# Patient Record
Sex: Male | Born: 1950 | Race: Black or African American | Hispanic: No | Marital: Married | Smoking: Never smoker
Health system: Southern US, Community
[De-identification: ages and names within clinical notes are randomized; demographics above are authoritative.]

## PROBLEM LIST (undated history)

## (undated) DIAGNOSIS — R972 Elevated prostate specific antigen [PSA]: Secondary | ICD-10-CM

## (undated) DIAGNOSIS — E785 Hyperlipidemia, unspecified: Secondary | ICD-10-CM

## (undated) DIAGNOSIS — T7840XA Allergy, unspecified, initial encounter: Secondary | ICD-10-CM

## (undated) DIAGNOSIS — M1990 Unspecified osteoarthritis, unspecified site: Secondary | ICD-10-CM

## (undated) DIAGNOSIS — N200 Calculus of kidney: Secondary | ICD-10-CM

## (undated) DIAGNOSIS — J45909 Unspecified asthma, uncomplicated: Secondary | ICD-10-CM

## (undated) DIAGNOSIS — Z87442 Personal history of urinary calculi: Secondary | ICD-10-CM

## (undated) HISTORY — DX: Calculus of kidney: N20.0

## (undated) HISTORY — DX: Hyperlipidemia, unspecified: E78.5

## (undated) HISTORY — DX: Unspecified asthma, uncomplicated: J45.909

## (undated) HISTORY — DX: Allergy, unspecified, initial encounter: T78.40XA

## (undated) HISTORY — DX: Elevated prostate specific antigen (PSA): R97.20

---

## 1992-09-20 HISTORY — PX: HERNIA REPAIR: SHX51

## 1999-09-21 HISTORY — PX: EXTRACORPOREAL SHOCK WAVE LITHOTRIPSY: SHX1557

## 2011-11-12 ENCOUNTER — Ambulatory Visit: Payer: Self-pay | Admitting: Gastroenterology

## 2012-02-24 DIAGNOSIS — S61211A Laceration without foreign body of left index finger without damage to nail, initial encounter: Secondary | ICD-10-CM | POA: Insufficient documentation

## 2013-10-24 DIAGNOSIS — H18603 Keratoconus, unspecified, bilateral: Secondary | ICD-10-CM | POA: Insufficient documentation

## 2014-03-14 DIAGNOSIS — R972 Elevated prostate specific antigen [PSA]: Secondary | ICD-10-CM | POA: Insufficient documentation

## 2015-03-31 ENCOUNTER — Ambulatory Visit: Payer: Self-pay | Admitting: Urology

## 2015-12-09 DIAGNOSIS — E7800 Pure hypercholesterolemia, unspecified: Secondary | ICD-10-CM | POA: Insufficient documentation

## 2015-12-09 DIAGNOSIS — L74510 Primary focal hyperhidrosis, axilla: Secondary | ICD-10-CM | POA: Insufficient documentation

## 2016-12-29 DIAGNOSIS — Z131 Encounter for screening for diabetes mellitus: Secondary | ICD-10-CM | POA: Insufficient documentation

## 2017-01-17 DIAGNOSIS — R7303 Prediabetes: Secondary | ICD-10-CM | POA: Insufficient documentation

## 2017-02-01 NOTE — Progress Notes (Signed)
02/03/2017 4:22 PM   Shane Abbott 1951/08/03 [id number]  Referring provider: Glendon Axe, MD [facility] [place], [place] [address]  Chief Complaint  Patient presents with  . New Patient (Initial Visit)    elevated psa referred by Dr. Glendon Axe    HPI: Patient is a 66 year old African American male who is referred by Dr. Glendon Axe for elevated PSA.  Patient's PSA was found to be 4.29 on 12/29/2016 during a routine physical exam. His previous PSAs have been 3.14 in June 2015 and 2.97 in March 2017.  BPH WITH LUTS His IPSS score today is 1, which is mild lower urinary tract symptomatology.   He is mostly satisfied with his quality life due to his urinary symptoms.  His major complaint today is frequency.   He has had these symptoms for several years.  He denies any dysuria, hematuria or suprapubic pain.   He also denies any recent fevers, chills, nausea or vomiting.  He does not have a family history of PCa.      IPSS    Row Name 02/03/17 1500         International Prostate Symptom Score   How often have you had the sensation of not emptying your bladder? Not at All     How often have you had to urinate less than every two hours? Less than 1 in 5 times     How often have you found you stopped and started again several times when you urinated? Not at All     How often have you found it difficult to postpone urination? Not at All     How often have you had a weak urinary stream? Not at All     How often have you had to strain to start urination? Not at All     How many times did you typically get up at night to urinate? None     Total IPSS Score 1       Quality of Life due to urinary symptoms   If you were to spend the rest of your life with your urinary condition just the way it is now how would you feel about that? Mostly Satisfied        Score:  1-7 Mild 8-19 Moderate 20-35 Severe   Erectile dysfunction His SHIM score  is 24, which is no ED.   His libido is preserved.   His risk factors for ED are age, BPH and HTN.   He denies any painful erections or curvatures with his erections.   He is still having/no longer having spontaneous erections.        SHIM    Row Name 02/03/17 1546         SHIM: Over the last 6 months:   How do you rate your confidence that you could get and keep an erection? High     When you had erections with sexual stimulation, how often were your erections hard enough for penetration (entering your partner)? Almost Always or Always     During sexual intercourse, how often were you able to maintain your erection after you had penetrated (entered) your partner? Almost Always or Always     During sexual intercourse, how difficult was it to maintain your erection to completion of intercourse? Not Difficult     When you attempted sexual intercourse, how often was it satisfactory for you? Almost Always or Always  SHIM Total Score   SHIM 24        Score: 1-7 Severe ED 8-11 Moderate ED 12-16 Mild-Moderate ED 17-21 Mild ED 22-25 No ED       PMH: No past medical history on file.  Surgical History: Past Surgical History:  Procedure Laterality Date  . EXTRACORPOREAL SHOCK WAVE LITHOTRIPSY    . HERNIA REPAIR  1998    Home Medications:  Allergies as of 02/03/2017   No Known Allergies     Medication List       Accurate as of 02/03/17  4:22 PM. Always use your most recent med list.          azithromycin 250 MG tablet Commonly known as:  ZITHROMAX Take 2 tablets (500mg ) by mouth on Day 1. Take 1 tablet (250mg ) by mouth on Days 2-5.   MULTI-VITAMINS Tabs Take by mouth.   sodium chloride 0.9 % nebulizer solution 5 ML ampule to be used off label for Scleral Contact lens insertion and rinsing.   Use as directed       Allergies: No Known Allergies  Family History: Family History  Problem Relation Age of Onset  . Prostate cancer Neg Hx   . Kidney cancer Neg  Hx   . Bladder Cancer Neg Hx   . Kidney disease Neg Hx     Social History:  reports that he has never smoked. He has never used smokeless tobacco. He reports that he drinks alcohol. He reports that he does not use drugs.  ROS: UROLOGY Frequent Urination?: No Hard to postpone urination?: No Burning/pain with urination?: No Get up at night to urinate?: No Leakage of urine?: No Urine stream starts and stops?: No Trouble starting stream?: No Do you have to strain to urinate?: No Blood in urine?: No Urinary tract infection?: No Sexually transmitted disease?: No Injury to kidneys or bladder?: No Painful intercourse?: No Weak stream?: No Erection problems?: No Penile pain?: No  Gastrointestinal Nausea?: No Vomiting?: No Indigestion/heartburn?: No Diarrhea?: No Constipation?: No  Constitutional Fever: No Night sweats?: No Weight loss?: No Fatigue?: No  Skin Skin rash/lesions?: No Itching?: No  Eyes Blurred vision?: No Double vision?: No  Ears/Nose/Throat Sore throat?: No Sinus problems?: No  Hematologic/Lymphatic Swollen glands?: No Easy bruising?: No  Cardiovascular Leg swelling?: No Chest pain?: No  Respiratory Cough?: No Shortness of breath?: No  Endocrine Excessive thirst?: No  Musculoskeletal Back pain?: No Joint pain?: No  Neurological Headaches?: No Dizziness?: No  Psychologic Depression?: No Anxiety?: No  Physical Exam: BP (!) 150/81   Pulse 82   Ht 6' (1.829 m)   Wt 186 lb (84.4 kg)   BMI 25.23 kg/m   Constitutional: Well nourished. Alert and oriented, No acute distress. HEENT: [place] AT, moist mucus membranes. Trachea midline, no masses. Cardiovascular: No clubbing, cyanosis, or edema. Respiratory: Normal respiratory effort, no increased work of breathing. GI: Abdomen is soft, non tender, non distended, no abdominal masses. Liver and spleen not palpable.  No hernias appreciated.  Stool sample for occult testing is not indicated.    GU: No CVA tenderness.  No bladder fullness or masses.  Patient with circumcised phallus.  Urethral meatus is patent.  No penile discharge. No penile lesions or rashes. Scrotum without lesions, cysts, rashes and/or edema.  Testicles are located scrotally bilaterally. No masses are appreciated in the testicles. Left and right epididymis are normal.  Small left hydrocele.   Rectal: Patient with  normal sphincter tone. Anus and perineum without scarring  or rashes. No rectal masses are appreciated. Prostate is approximately 60 + grams, no nodules are appreciated. Seminal vesicles could not be palpated due to prostate size.   Skin: No rashes, bruises or suspicious lesions. Lymph: No cervical or inguinal adenopathy. Neurologic: Grossly intact, no focal deficits, moving all 4 extremities. Psychiatric: Normal mood and affect.  Laboratory Data:  Assessment & Plan:    1. Elevated PSA  - I discussed with the patient that PSA is an acronym for  prostate specific antigen,  which is a protein made by the prostate gland and can be detected in the blood stream. I explained to the patient situations that would increase the PSA, such as: a man's age,  BPH, infection, recent intercourse/ejaculation, prostate infarction, recent urethroscopic manipulation (Foley placement/cystoscopy) and prostate cancer.    - We discussed that indications for prostate biopsy are defined by age and race specific PSA cutoffs as well as a PSA velocity of 0.75/year.  -  At this time, I have advised the patient that we will repeat the PSA to rule out lab error.  If that should return elevated, I will add a free and total PSA  2. BPH with LUTS  - IPSS score is 1/2  - Continue conservative management, avoiding bladder irritants and timed voiding's  - most bothersome symptoms is/are frequency  - RTC pending PSA  3. Erectile dysfunction  - SHIM score is 24  - RTC in 12 months for repeat SHIM score and exam   Return for pending PSA  results.  These notes generated with voice recognition software. I apologize for typographical errors.  Zara Council, [facility] [address] [place], [place] [address] [phone]

## 2017-02-03 ENCOUNTER — Encounter: Payer: Self-pay | Admitting: Urology

## 2017-02-03 ENCOUNTER — Ambulatory Visit (INDEPENDENT_AMBULATORY_CARE_PROVIDER_SITE_OTHER): Payer: BLUE CROSS/BLUE SHIELD | Admitting: Urology

## 2017-02-03 VITALS — BP 150/81 | HR 82 | Ht 72.0 in | Wt 186.0 lb

## 2017-02-03 DIAGNOSIS — N138 Other obstructive and reflux uropathy: Secondary | ICD-10-CM | POA: Diagnosis not present

## 2017-02-03 DIAGNOSIS — R972 Elevated prostate specific antigen [PSA]: Secondary | ICD-10-CM

## 2017-02-03 DIAGNOSIS — N401 Enlarged prostate with lower urinary tract symptoms: Secondary | ICD-10-CM | POA: Diagnosis not present

## 2017-02-04 ENCOUNTER — Telehealth: Payer: Self-pay

## 2017-02-04 LAB — PSA: PROSTATE SPECIFIC AG, SERUM: 4.6 ng/mL — AB (ref 0.0–4.0)

## 2017-02-04 NOTE — Telephone Encounter (Signed)
Labs added. Pt made aware of elevated PSA and additional test being ran.

## 2017-02-04 NOTE — Telephone Encounter (Signed)
-----   Message from Nori Riis, PA-C sent at 02/04/2017  6:16 AM EDT ----- Would you please add a free and total PSA to his blood work?  Also, please tell the patient that his PSA is still a little high at 4.6.

## 2017-02-09 LAB — PSA, TOTAL AND FREE
PSA FREE: 0.68 ng/mL
PSA, Free Pct: 14.8 %
Prostate Specific Ag, Serum: 4.6 ng/mL — ABNORMAL HIGH (ref 0.0–4.0)

## 2017-02-09 LAB — SPECIMEN STATUS REPORT

## 2017-02-15 ENCOUNTER — Telehealth: Payer: Self-pay

## 2017-02-15 DIAGNOSIS — R972 Elevated prostate specific antigen [PSA]: Secondary | ICD-10-CM

## 2017-02-15 NOTE — Telephone Encounter (Signed)
-----   Message from Nori Riis, PA-C sent at 02/14/2017  2:51 PM EDT ----- Please let the patient's known that the additional blood work noted a 24% probability of having prostate cancer at this time.  I would suggest that we repeat the PSA in 3 months to ensure stability.

## 2017-02-15 NOTE — Telephone Encounter (Signed)
Spoke with pt in reference to PSA results. Made pt aware will need to have labs again in 65mo. Lab appt made and orders placed. Pt voiced understanding.

## 2017-05-18 ENCOUNTER — Other Ambulatory Visit: Payer: BLUE CROSS/BLUE SHIELD

## 2017-05-18 DIAGNOSIS — R972 Elevated prostate specific antigen [PSA]: Secondary | ICD-10-CM

## 2017-05-19 LAB — PSA: Prostate Specific Ag, Serum: 3.3 ng/mL (ref 0.0–4.0)

## 2017-05-24 ENCOUNTER — Telehealth: Payer: Self-pay

## 2017-05-24 DIAGNOSIS — R972 Elevated prostate specific antigen [PSA]: Secondary | ICD-10-CM

## 2017-05-24 NOTE — Telephone Encounter (Signed)
-----   Message from Nori Riis, PA-C sent at 05/19/2017  8:29 AM EDT ----- Please let Mr Cryder know that his PSA has lowered to 3.3.  I would like to see him in 3 months.  He will need a PSA drawn prior to his appointment.

## 2017-05-24 NOTE — Telephone Encounter (Signed)
Spoke with pt in reference to PSA results. Made aware will need to be seen in 41mo. Pt voiced understanding. OV and lab appts made. Orders placed.

## 2017-08-18 ENCOUNTER — Other Ambulatory Visit: Payer: BLUE CROSS/BLUE SHIELD

## 2017-08-18 DIAGNOSIS — R972 Elevated prostate specific antigen [PSA]: Secondary | ICD-10-CM

## 2017-08-19 LAB — PSA: Prostate Specific Ag, Serum: 3.8 ng/mL (ref 0.0–4.0)

## 2017-08-22 NOTE — Progress Notes (Signed)
08/23/2017 9:02 AM   Shane Abbott [birth year] [id number]  Referring provider: Glendon Axe, MD [facility] [place], [place] [address]  Chief Complaint  Patient presents with  . Elevated PSA    HPI: Patient is a 66 year old African American male with a history of elevated PSA, BPH with LU TS and ED who presents today for a 3 months follow up.    History of elevated PSA 3.14 in 02/2014 2.97 in 11/2015 4.29 in 12/2016 4.6 in 01/2017 free PSA 0.68 3.3 in 04/2017 3.8 in 07/2017  BPH WITH LUTS His IPSS score today is 7, which is mild lower urinary tract symptomatology.   He is pleased with his quality life due to his urinary symptoms.  His previous I PSS score was 1/2.  His major complaint(s) today is/are nocturia on occasion.  He has had these symptoms for several months.  He denies any dysuria, hematuria or suprapubic pain.   He also denies any recent fevers, chills, nausea or vomiting.  He does not have a family history of PCa.  IPSS    Row Name 08/23/17 0800         International Prostate Symptom Score   How often have you had the sensation of not emptying your bladder?  Less than half the time     How often have you had to urinate less than every two hours?  Less than 1 in 5 times     How often have you found you stopped and started again several times when you urinated?  Less than 1 in 5 times     How often have you found it difficult to postpone urination?  Not at All     How often have you had a weak urinary stream?  Less than 1 in 5 times     How often have you had to strain to start urination?  Less than 1 in 5 times     How many times did you typically get up at night to urinate?  1 Time     Total IPSS Score  7       Quality of Life due to urinary symptoms   If you were to spend the rest of your life with your urinary condition just the way it is now how would you feel about that?  Pleased        Score:  1-7 Mild 8-19  Moderate 20-35 Severe   Erectile dysfunction His SHIM score is 21, which is mild ED.   His libido is preserved.   His previous SHIM score was 24.  His risk factors for ED are age, BPH and HTN.   He denies any painful erections or curvatures with his erections.   He is still having spontaneous erections.  [place] Name 08/23/17 0848         SHIM: Over the last 6 months:   How do you rate your confidence that you could get and keep an erection?  High     When you had erections with sexual stimulation, how often were your erections hard enough for penetration (entering your partner)?  Almost Always or Always     During sexual intercourse, how often were you able to maintain your erection after you had penetrated (entered) your partner?  A Few Times (much less than half the time)     During sexual intercourse, how difficult was it to maintain your erection to completion  of intercourse?  Not Difficult     When you attempted sexual intercourse, how often was it satisfactory for you?  Almost Always or Always       SHIM Total Score   SHIM  21        Score: 1-7 Severe ED 8-11 Moderate ED 12-16 Mild-Moderate ED 17-21 Mild ED 22-25 No ED   PMH: Past Medical History:  Diagnosis Date  . Allergy    Seasonal  . Asthma    as a child  . Elevated PSA   . Hyperlipidemia   . Kidney stone     Surgical History: Past Surgical History:  Procedure Laterality Date  . EXTRACORPOREAL SHOCK WAVE LITHOTRIPSY  2001  . HERNIA REPAIR  1994   Right Inguinal- [facility] Medications:  Allergies as of 08/23/2017   No Known Allergies     Medication List        Accurate as of 08/23/17  9:02 AM. Always use your most recent med list.          MULTI-VITAMINS Tabs Take 1 tablet by mouth daily.   sodium chloride 0.9 % nebulizer solution 5 ML ampule to be used off label for Scleral Contact lens insertion and rinsing.   Use as directed       Allergies: No Known  Allergies  Family History: Family History  Problem Relation Age of Onset  . Healthy Father   . Heart disease Mother   . Healthy Sister   . Healthy Sister   . Heart attack Brother   . Prostate cancer Neg Hx   . Kidney cancer Neg Hx   . Bladder Cancer Neg Hx   . Kidney disease Neg Hx     Social History:  reports that  has never smoked. he has never used smokeless tobacco. He reports that he drinks alcohol. He reports that he does not use drugs.  ROS: UROLOGY Frequent Urination?: No Hard to postpone urination?: No Burning/pain with urination?: No Get up at night to urinate?: Yes Leakage of urine?: No Urine stream starts and stops?: No Trouble starting stream?: No Do you have to strain to urinate?: No Blood in urine?: No Urinary tract infection?: No Sexually transmitted disease?: No Injury to kidneys or bladder?: No Painful intercourse?: No Weak stream?: No Erection problems?: No Penile pain?: No  Gastrointestinal Nausea?: No Vomiting?: No Indigestion/heartburn?: No Diarrhea?: No Constipation?: No  Constitutional Fever: No Night sweats?: No Weight loss?: No Fatigue?: No  Skin Skin rash/lesions?: No Itching?: No  Eyes Blurred vision?: No Double vision?: No  Ears/Nose/Throat Sore throat?: No Sinus problems?: No  Hematologic/Lymphatic Swollen glands?: No Easy bruising?: No  Cardiovascular Leg swelling?: No Chest pain?: No  Respiratory Cough?: No Shortness of breath?: No  Endocrine Excessive thirst?: No  Musculoskeletal Back pain?: No Joint pain?: No  Neurological Headaches?: No Dizziness?: No  Psychologic Depression?: No Anxiety?: No  Physical Exam: BP 130/83   Pulse 81   Ht 6' (1.829 m)   Wt 177 lb 9.6 oz (80.6 kg)   BMI 24.09 kg/m   Constitutional: Well nourished. Alert and oriented, No acute distress. HEENT: [place] AT, moist mucus membranes. Trachea midline, no masses. Cardiovascular: No clubbing, cyanosis, or  edema. Respiratory: Normal respiratory effort, no increased work of breathing. GI: Abdomen is soft, non tender, non distended, no abdominal masses. Liver and spleen not palpable.  No hernias appreciated.  Stool sample for occult testing is not indicated.   GU: No  CVA tenderness.  No bladder fullness or masses.  Patient with circumcised phallus.  Urethral meatus is patent.  No penile discharge. No penile lesions or rashes. Scrotum without lesions, cysts, rashes and/or edema.  Testicles are located scrotally bilaterally. No masses are appreciated in the testicles. Left and right epididymis are normal.  Small left hydrocele.   Rectal: Patient with  normal sphincter tone. Anus and perineum without scarring or rashes. No rectal masses are appreciated. Prostate is approximately 60 + grams, no nodules are appreciated. Seminal vesicles could not be palpated due to prostate size.   Skin: No rashes, bruises or suspicious lesions. Lymph: No cervical or inguinal adenopathy. Neurologic: Grossly intact, no focal deficits, moving all 4 extremities. Psychiatric: Normal mood and affect.  Laboratory Data:  Assessment & Plan:    1. Rising PSA  - current PSA is 3.8 - trending up  - discussed with the patient that PSA is an acronym for  prostate specific antigen,  which is a protein made by the prostate gland and can be detected in the blood stream. I explained to the patient situations that would increase the PSA, such as: a man's age,  BPH, infection, recent intercourse/ejaculation, prostate infarction, recent urethroscopic manipulation (Foley placement/cystoscopy) and prostate cancer.   -We discussed that indications for prostate biopsy are defined by age and race specific PSA cutoffs as well as a PSA velocity of 0.75/year.  - reviewed the implications of a rising PSA and the uncertainty surrounding it. In general, a man's PSA increases with age and is produced by both normal and cancerous prostate tissue.  Differential for a rise in PSA is BPH, prostate cancer, infection, recent intercourse/ejaculation, prostate infarction, recent urethroscopic manipulation (foley placement/cystoscopy) and prostatitis. Management of a rising PSA can include observation or prostate biopsy and we discussed this in detail.             - discussed repeating the PSA in 3 months or obtaining a 4K score at this time - I felt a prostate biopsy is not indicated at this time  - offered the 4K score test to the patient - explained to the patient that the 4K score test result is the individual patient's risk for aggressive prostate cancer of Gleason's score 7 and higher if a prostate biopsy were to be performed. The 4K score is calculated from blood test results of four kallikrein proteins: Total PSA, Free PSA, Intact PSA and human kallikrein-related peptidase 2 (hK2), combined with patient age, DRE result (if reported), and history of no prostate biopsy or prior negative prostate biopsy.    - patient would like to repeat the PSA in three months  2. BPH with LUTS  - IPSS score is 7/1  - Continue conservative management, avoiding bladder irritants and timed voiding's  - most bothersome symptoms is/are nocturia on occasion  - RTC in 6 months for IPSS, PSA, and exam   3. Erectile dysfunction  - SHIM score is 21, it is slightly worsening  - I explained to the patient that in order to achieve an erection it takes good functioning of the nervous system (parasympathetic and rs, sympathetic, sensory and motor), good blood flow into the erectile tissue of the penis and a desire to have sex  - I explained that conditions like diabetes, hypertension, coronary artery disease, peripheral vascular disease, smoking, alcohol consumption, age, sleep apnea and BPH can diminish the ability to have an erection  - I explained the ED may be a risk marker for  underlying CVD and he should follow up with PCP for further studies   - A recent study  published in Sex Med 2018 Apr 13 revealed moderate to vigorous aerobic exercise for 40 minutes 4 times per week can decrease erectile problems caused by physical inactivity, obesity, hypertension, metabolic syndrome and/or cardiovascular diseases  - RTC in 6 months for exam and SHIM  Return in about 3 months (around 11/21/2017) for PSA only.  These notes generated with voice recognition software. I apologize for typographical errors.  Zara Council, [facility] [address], [place], [place] [address] [phone]

## 2017-08-23 ENCOUNTER — Ambulatory Visit: Payer: BLUE CROSS/BLUE SHIELD | Admitting: Urology

## 2017-08-23 ENCOUNTER — Encounter: Payer: Self-pay | Admitting: Urology

## 2017-08-23 VITALS — BP 130/83 | HR 81 | Ht 72.0 in | Wt 177.6 lb

## 2017-08-23 DIAGNOSIS — N529 Male erectile dysfunction, unspecified: Secondary | ICD-10-CM | POA: Diagnosis not present

## 2017-08-23 DIAGNOSIS — N138 Other obstructive and reflux uropathy: Secondary | ICD-10-CM | POA: Diagnosis not present

## 2017-08-23 DIAGNOSIS — R972 Elevated prostate specific antigen [PSA]: Secondary | ICD-10-CM | POA: Insufficient documentation

## 2017-08-23 DIAGNOSIS — E785 Hyperlipidemia, unspecified: Secondary | ICD-10-CM | POA: Insufficient documentation

## 2017-08-23 DIAGNOSIS — N401 Enlarged prostate with lower urinary tract symptoms: Secondary | ICD-10-CM | POA: Diagnosis not present

## 2017-11-23 ENCOUNTER — Other Ambulatory Visit: Payer: BLUE CROSS/BLUE SHIELD

## 2017-11-23 ENCOUNTER — Other Ambulatory Visit: Payer: Self-pay

## 2017-11-23 DIAGNOSIS — R972 Elevated prostate specific antigen [PSA]: Secondary | ICD-10-CM

## 2017-11-24 LAB — PSA: Prostate Specific Ag, Serum: 3.4 ng/mL (ref 0.0–4.0)

## 2017-11-25 ENCOUNTER — Telehealth: Payer: Self-pay

## 2017-11-25 DIAGNOSIS — R972 Elevated prostate specific antigen [PSA]: Secondary | ICD-10-CM

## 2017-11-25 NOTE — Telephone Encounter (Signed)
-----   Message from Nori Riis, PA-C sent at 11/24/2017  7:44 AM EST ----- Please let Mr. Quant know that his PSA has come down to 3.4.  I would like to see him in 3 months for a repeated PSA and office visit.

## 2017-11-25 NOTE — Telephone Encounter (Signed)
Letter sent. Appts made and orders placed.

## 2018-02-21 ENCOUNTER — Other Ambulatory Visit: Payer: BLUE CROSS/BLUE SHIELD

## 2018-02-21 DIAGNOSIS — R972 Elevated prostate specific antigen [PSA]: Secondary | ICD-10-CM

## 2018-02-22 LAB — PSA: Prostate Specific Ag, Serum: 3.9 ng/mL (ref 0.0–4.0)

## 2018-02-23 ENCOUNTER — Ambulatory Visit: Payer: Self-pay | Admitting: Urology

## 2018-03-01 ENCOUNTER — Ambulatory Visit: Payer: Self-pay | Admitting: Urology

## 2018-03-18 NOTE — Progress Notes (Deleted)
03/20/2018 2:08 PM   Shane Abbott March 04, 1951 [id number]  Referring provider: Glendon Axe, MD [facility] [place], [place] [address]  No chief complaint on file.   HPI: Patient is a 67 year old African American male with a history of elevated PSA, BPH with LU TS and ED who presents today for a 3 months follow up.    History of elevated PSA 3.14 in 02/2014 2.97 in 11/2015 4.29 in 12/2016 4.6 in 01/2017 free PSA 0.68 3.3 in 04/2017 3.8 in 07/2017 3.4 in 11/2017 3.9 in 02/2018  BPH WITH LUTS His IPSS score today is ***, which is *** lower urinary tract symptomatology.   He is *** with his quality life due to his urinary symptoms.  His previous I PSS score was 7/1.  His major complaint(s) today is/are nocturia on occasion.  He has had these symptoms for several months.  He denies any dysuria, hematuria or suprapubic pain.   He also denies any recent fevers, chills, nausea or vomiting.  He does not have a family history of PCa.  ***    Score:  1-7 Mild 8-19 Moderate 20-35 Severe   Erectile dysfunction His SHIM score is ***, which is *** ED.   His libido is preserved.   His previous SHIM score was 21.  His risk factors for ED are age, BPH and HTN.   He denies any painful erections or curvatures with his erections.   He is still having spontaneous erections.    Score: 1-7 Severe ED 8-11 Moderate ED 12-16 Mild-Moderate ED 17-21 Mild ED 22-25 No ED   PMH: Past Medical History:  Diagnosis Date  . Allergy    Seasonal  . Asthma    as a child  . Elevated PSA   . Hyperlipidemia   . Kidney stone     Surgical History: Past Surgical History:  Procedure Laterality Date  . EXTRACORPOREAL SHOCK WAVE LITHOTRIPSY  2001  . HERNIA REPAIR  1994   Right Inguinal- [facility] Medications:  Allergies as of 03/20/2018   No Known Allergies     Medication List        Accurate as of 03/18/18  2:08 PM. Always use  your most recent med list.          MULTI-VITAMINS Tabs Take 1 tablet by mouth daily.   sodium chloride 0.9 % nebulizer solution 5 ML ampule to be used off label for Scleral Contact lens insertion and rinsing.   Use as directed       Allergies: No Known Allergies  Family History: Family History  Problem Relation Age of Onset  . Healthy Father   . Heart disease Mother   . Healthy Sister   . Healthy Sister   . Heart attack Brother   . Prostate cancer Neg Hx   . Kidney cancer Neg Hx   . Bladder Cancer Neg Hx   . Kidney disease Neg Hx     Social History:  reports that he has never smoked. He has never used smokeless tobacco. He reports that he drinks alcohol. He reports that he does not use drugs.  ROS:                                        Physical Exam: There were no vitals taken for this visit.  Constitutional: Well nourished.  Alert and oriented, No acute distress. HEENT: [place] AT, moist mucus membranes. Trachea midline, no masses. Cardiovascular: No clubbing, cyanosis, or edema. Respiratory: Normal respiratory effort, no increased work of breathing. GI: Abdomen is soft, non tender, non distended, no abdominal masses. Liver and spleen not palpable.  No hernias appreciated.  Stool sample for occult testing is not indicated.   GU: No CVA tenderness.  No bladder fullness or masses.  Patient with circumcised phallus.  Urethral meatus is patent.  No penile discharge. No penile lesions or rashes. Scrotum without lesions, cysts, rashes and/or edema.  Testicles are located scrotally bilaterally. No masses are appreciated in the testicles. Left and right epididymis are normal.  Small left hydrocele.   Rectal: Patient with  normal sphincter tone. Anus and perineum without scarring or rashes. No rectal masses are appreciated. Prostate is approximately 60 + grams, no nodules are appreciated. Seminal vesicles could not be palpated due to prostate size.   Skin: No  rashes, bruises or suspicious lesions. Lymph: No cervical or inguinal adenopathy. Neurologic: Grossly intact, no focal deficits, moving all 4 extremities. Psychiatric: Normal mood and affect. ***  Constitutional: Well nourished. Alert and oriented, No acute distress. HEENT: [place] AT, moist mucus membranes. Trachea midline, no masses. Cardiovascular: No clubbing, cyanosis, or edema. Respiratory: Normal respiratory effort, no increased work of breathing. GI: Abdomen is soft, non tender, non distended, no abdominal masses. Liver and spleen not palpable.  No hernias appreciated.  Stool sample for occult testing is not indicated.   GU: No CVA tenderness.  No bladder fullness or masses.  Patient with circumcised/uncircumcised phallus. ***Foreskin easily retracted***  Urethral meatus is patent.  No penile discharge. No penile lesions or rashes. Scrotum without lesions, cysts, rashes and/or edema.  Testicles are located scrotally bilaterally. No masses are appreciated in the testicles. Left and right epididymis are normal. Rectal: Patient with  normal sphincter tone. Anus and perineum without scarring or rashes. No rectal masses are appreciated. Prostate is approximately *** grams, *** nodules are appreciated. Seminal vesicles are normal. Skin: No rashes, bruises or suspicious lesions. Lymph: No cervical or inguinal adenopathy. Neurologic: Grossly intact, no focal deficits, moving all 4 extremities. Psychiatric: Normal mood and affect.   Laboratory Data:  Assessment & Plan:    1. Rising PSA velocity PSA is trending up with current PSA at 3.9 NCCN guidelines state that a PSA over 3 is concerning for prostate cancer I would recommend a prostate biopsy at this time rather than to continue to monitoring the PSA at closer intervals as this may result in a delay in prostate cancer diagnosis *** ***  2. BPH with LUTS IPSS score is 7/1 Continue conservative management, avoiding bladder irritants and  timed voiding's most bothersome symptoms is/are nocturia on occasion RTC in ***  3. Erectile dysfunction SHIM score is 21, it is slightly worsening RTC in 6 months for exam and SHIM  No follow-ups on file.  These notes generated with voice recognition software. I apologize for typographical errors.  Zara Council, [facility] [address], [place], [place] [address] [phone]

## 2018-03-20 ENCOUNTER — Ambulatory Visit: Payer: Self-pay | Admitting: Urology

## 2018-04-16 NOTE — Progress Notes (Addendum)
04/18/2018 7:34 AM   Shane Abbott 06/09/1951 [id number]  Referring provider: Glendon Axe, MD [facility] [place], [place] [address]  Chief Complaint  Patient presents with  . Elevated PSA    HPI: Patient is a 67 year old African American male with a history of elevated PSA, BPH with LU TS and ED who presents today for a 3 months follow up.    Since his last visit with [place], he has been diagnosed with a right ureteral stone.  He states about one week ago, he had the sudden onset of right flank pain.  6/10 pain.  He states it was colicky in nature.  Patient denies any gross hematuria, dysuria or suprapubic/flank pain.  Patient denies any fevers, chills, nausea or vomiting.  CT Renal stone study on 04/11/2018 noted  6 mm mid right ureteral stone with mild right hydroureteronephrosis and trace perinephric stranding. He was given Percocet and Flomax in the [facility] ER.  He did not fill the Flomax due to his fear of sexual side effects.   History of elevated PSA 3.14 in 02/2014 2.97 in 11/2015 4.29 in 12/2016 4.6 in 01/2017 free PSA 0.68 3.3 in 04/2017 3.8 in 07/2017 3.4 in 11/2017 3.9 in 02/2018  BPH WITH LUTS His IPSS score today is 2, which is mild lower urinary tract symptomatology.   He is pleased with his quality life due to his urinary symptoms.  His previous I PSS score was 7/1.  His major complaint(s) today is blood in the urine.  He denies any dysuria, hematuria or suprapubic pain.   He also denies any recent fevers, chills, nausea or vomiting.  He does not have a family history of PCa.    IPSS    Row Name 04/18/18 0900         International Prostate Symptom Score   How often have you had the sensation of not emptying your bladder?  Not at All     How often have you had to urinate less than every two hours?  Less than 1 in 5 times     How often have you found you stopped and started again several times when you urinated?  Not at All     How often have you found it difficult to postpone urination?  Not at All     How often have you had a weak urinary stream?  Not at All     How often have you had to strain to start urination?  Less than 1 in 5 times     Total IPSS Score  2       Quality of Life due to urinary symptoms   If you were to spend the rest of your life with your urinary condition just the way it is now how would you feel about that?  Pleased        Score:  1-7 Mild 8-19 Moderate 20-35 Severe   Erectile dysfunction His SHIM score is 24, which is no ED.   His libido is preserved.   His previous SHIM score was 21.  His risk factors for ED are age, BPH and HTN.   He denies any painful erections or curvatures with his erections.   He is still having spontaneous erections.  SHIM    Row Name 04/18/18 0919         SHIM: Over the last 6 months:   How do you rate your confidence that you could get and  keep an erection?  High     When you had erections with sexual stimulation, how often were your erections hard enough for penetration (entering your partner)?  Almost Always or Always     During sexual intercourse, how often were you able to maintain your erection after you had penetrated (entered) your partner?  Almost Always or Always     During sexual intercourse, how difficult was it to maintain your erection to completion of intercourse?  Not Difficult     When you attempted sexual intercourse, how often was it satisfactory for you?  Almost Always or Always       SHIM Total Score   SHIM  24        Score: 1-7 Severe ED 8-11 Moderate ED 12-16 Mild-Moderate ED 17-21 Mild ED 22-25 No ED   PMH: Past Medical History:  Diagnosis Date  . Allergy    Seasonal  . Asthma    as a child  . Elevated PSA   . Hyperlipidemia   . Kidney stone     Surgical History: Past Surgical History:  Procedure Laterality Date  . EXTRACORPOREAL SHOCK WAVE LITHOTRIPSY  2001  . HERNIA REPAIR  1994   Right Inguinal-  [facility] Medications:  Allergies as of 04/18/2018   No Known Allergies     Medication List        Accurate as of 04/18/18 11:59 PM. Always use your most recent med list.          MULTI-VITAMINS Tabs Take 1 tablet by mouth daily.   oxyCODONE-acetaminophen 5-325 MG tablet Commonly known as:  PERCOCET/ROXICET Take 1 tablet by mouth every 8 (eight) hours as needed. for pain   sodium chloride 0.9 % nebulizer solution 5 ML ampule to be used off label for Scleral Contact lens insertion and rinsing.   Use as directed   tamsulosin 0.4 MG Caps capsule Commonly known as:  FLOMAX Take by mouth.       Allergies: No Known Allergies  Family History: Family History  Problem Relation Age of Onset  . Healthy Father   . Heart disease Mother   . Healthy Sister   . Healthy Sister   . Heart attack Brother   . Prostate cancer Neg Hx   . Kidney cancer Neg Hx   . Bladder Cancer Neg Hx   . Kidney disease Neg Hx     Social History:  reports that he has never smoked. He has never used smokeless tobacco. He reports that he drinks alcohol. He reports that he does not use drugs.  ROS: UROLOGY Frequent Urination?: No Hard to postpone urination?: No Burning/pain with urination?: No Get up at night to urinate?: No Leakage of urine?: No Urine stream starts and stops?: No Trouble starting stream?: No Do you have to strain to urinate?: No Blood in urine?: Yes Urinary tract infection?: No Sexually transmitted disease?: No Injury to kidneys or bladder?: No Painful intercourse?: No Weak stream?: No Erection problems?: No Penile pain?: No  Gastrointestinal Nausea?: No Vomiting?: No Indigestion/heartburn?: No Diarrhea?: No Constipation?: No  Constitutional Fever: No Night sweats?: No Weight loss?: Yes Fatigue?: No  Skin Skin rash/lesions?: No Itching?: No  Eyes Blurred vision?: No Double vision?: No  Ears/Nose/Throat Sore throat?: No Sinus  problems?: No  Hematologic/Lymphatic Swollen glands?: No Easy bruising?: No  Cardiovascular Leg swelling?: No Chest pain?: No  Respiratory Cough?: No Shortness of breath?: No  Endocrine Excessive thirst?: No  Musculoskeletal  Back pain?: No Joint pain?: No  Neurological Headaches?: No Dizziness?: No  Psychologic Depression?: No Anxiety?: No  Physical Exam: BP 111/70   Pulse 64   Ht 6' (1.829 m)   Wt 177 lb 8 oz (80.5 kg)   BMI 24.07 kg/m   Constitutional: Well nourished. Alert and oriented, No acute distress. HEENT: [place] AT, moist mucus membranes. Trachea midline, no masses. Cardiovascular: No clubbing, cyanosis, or edema. Respiratory: Normal respiratory effort, no increased work of breathing. GI: Abdomen is soft, non tender, non distended, no abdominal masses. Liver and spleen not palpable.  No hernias appreciated.  Stool sample for occult testing is not indicated.   GU: No CVA tenderness.  No bladder fullness or masses.  Patient with circumcised phallus.  Urethral meatus is patent.  No penile discharge. No penile lesions or rashes. Scrotum without lesions, cysts, rashes and/or edema.  Testicles are located scrotally bilaterally. No masses are appreciated in the testicles. Left and right epididymis are normal. Rectal: Patient with  normal sphincter tone. Anus and perineum without scarring or rashes. No rectal masses are appreciated. Prostate is approximately 60 + grams, no nodules are appreciated. Seminal vesicles are normal. Skin: No rashes, bruises or suspicious lesions. Lymph: No cervical or inguinal adenopathy. Neurologic: Grossly intact, no focal deficits, moving all 4 extremities. Psychiatric: Normal mood and affect.   Laboratory Data:  Pertinent Imaging Result Narrative  EXAM:CT Abdomen and CT Pelvis, Unenhanced, Stone Protocol   INDICATION:67 year old with flank pain. Right lower quadrant abdominal pain..  COMPARISON: None.  TECHNIQUE: Helical  axial images were obtained from the top of kidneys to the symphysis pubis with no oral or intravenous contrast. Dose reduction was obtained with Automatic Exposure Control (AEC). If AEC could not be utilized then dose was determined by manual adjustment of the mA and/or kV according to patient size.  FINDINGS:   Note: This study is specifically tailored for the evaluation of the kidneys and ureters with regards to stone disease.Identification of pathology of the solid organs and bowel is limited due to the absence of intravenous contrast.  Right Kidney and collecting system: There is a 6 mm stone in the mid right ureter (2:48 corresponding with 3:30) with mild right hydroureteronephrosis proximal to the stone and trace periureteral stranding. The distal half of the ureter is decompressed. No additional stones are present.  Left Kidney and collecting system: There is no renal calculus, hydronephrosis, or perinephric edema.There is no hydroureter or ureteral calculus.  Bladder: No abnormalities identified.  Pelvis: No free fluid in the pelvis. Prostatomegaly.  Abdomen:  Sensitivity for detection of visceral pathology is limited without IV contrast. Gallbladder is decompressed. Liver, pancreas and spleen are all normal. No adrenal mass. Moderate stool in the colon. No bowel obstruction. Mild fatty change in the left inguinal canal. No destructive or suspicious bony lesions.  The lung bases are clear.  IMPRESSION:  6 mm mid right ureteral stone with mild right hydroureteronephrosis and trace perinephric stranding.   MIPS 362: DICOM format image data available to non-affiliated external healthcare facilities or entities on a secure, media free, reciprocally searchable basis with patient authorization for at least a 12 month period after the study.    Electronically Signed JJ:KKXFGH Fish, MD, [facility] Electronically Signed on:04/11/2018 3:14 PM   Cannot  see the films.    Pertinent Imaging CLINICAL DATA:  History of right-sided kidney stone. Currently asymptomatic. Unsure if the patient has passed the most recent stone.  EXAM: ABDOMEN - 1 VIEW  COMPARISON:  None in PACs  FINDINGS: No abnormal calcifications are observed overlying either kidney. There is ill-defined calcification that may lie at the right UPJ or in the proximal right ureter. It lies between the right transverse processes of L3 and L4 and measures approximately 3 by 5 mm. More inferiorly no ureteral stones or bladder stones are observed.  The bowel gas pattern is unremarkable. The bony thorax exhibits no acute abnormality.  IMPRESSION: Probable 3 x 5 mm stone in the proximal right ureter.   Electronically Signed   By: David  Martinique M.D.   On: 04/18/2018 14:26  I have independently reviewed the films  Assessment & Plan:    1. Right ureteral stone KUB today demonstrates the persistence of the stone Encouraged him to fill the Flomax prescription Advised to contact our office or seek treatment in the ED if becomes febrile or pain/ vomiting are difficult control in order to arrange for emergent/urgent intervention  2. Rising PSA velocity PSA is trending up with current PSA at 3.9 NCCN guidelines state that a PSA over 3 is concerning for prostate cancer I would recommend a prostate biopsy at this time rather than to continue to monitoring the PSA at closer intervals as this may result in a delay in prostate cancer diagnosis - he is hesitant to have a biopsy at this time - will recheck a PSA today to see if it returns to baseline  3. BPH with LUTS IPSS score is 2/1, it is improving Continue conservative management, avoiding bladder irritants and timed voiding's most bothersome symptoms is/are nocturia on occasion RTC in pending PSA   4. Erectile dysfunction SHIM score is 24, it is improving  RTC in 6 months for exam and SHIM  Return for pending  PSA .  These notes generated with voice recognition software. I apologize for typographical errors.  Royden Purl  [facility] [address] [place], [place] [address] [phone]  Addendum: Patient contacted the office and he now wants to pursue right ESWL at this time.  He will be schedule right ESWL at this time.

## 2018-04-18 ENCOUNTER — Telehealth: Payer: Self-pay | Admitting: Family Medicine

## 2018-04-18 ENCOUNTER — Ambulatory Visit
Admission: RE | Admit: 2018-04-18 | Discharge: 2018-04-18 | Disposition: A | Discharge status: Home / Self Care | Payer: BLUE CROSS/BLUE SHIELD | Source: Ambulatory Visit | Attending: Urology | Admitting: Urology

## 2018-04-18 ENCOUNTER — Encounter: Payer: Self-pay | Admitting: Urology

## 2018-04-18 ENCOUNTER — Ambulatory Visit: Payer: BLUE CROSS/BLUE SHIELD | Admitting: Urology

## 2018-04-18 ENCOUNTER — Other Ambulatory Visit: Payer: Self-pay

## 2018-04-18 VITALS — BP 111/70 | HR 64 | Ht 72.0 in | Wt 177.5 lb

## 2018-04-18 DIAGNOSIS — N138 Other obstructive and reflux uropathy: Secondary | ICD-10-CM

## 2018-04-18 DIAGNOSIS — N201 Calculus of ureter: Secondary | ICD-10-CM | POA: Diagnosis not present

## 2018-04-18 DIAGNOSIS — R972 Elevated prostate specific antigen [PSA]: Secondary | ICD-10-CM | POA: Diagnosis not present

## 2018-04-18 DIAGNOSIS — R109 Unspecified abdominal pain: Secondary | ICD-10-CM | POA: Insufficient documentation

## 2018-04-18 DIAGNOSIS — N529 Male erectile dysfunction, unspecified: Secondary | ICD-10-CM

## 2018-04-18 DIAGNOSIS — N401 Enlarged prostate with lower urinary tract symptoms: Secondary | ICD-10-CM | POA: Diagnosis not present

## 2018-04-18 NOTE — Telephone Encounter (Signed)
-----   Message from Nori Riis, PA-C sent at 04/18/2018  2:46 PM EDT ----- Please let Mr. Kegley know that his stone is still there.  We should take another x-ray in 2 to 3 weeks.

## 2018-04-18 NOTE — Telephone Encounter (Signed)
Patient notified and will go for KUB in a couple weeks. KUB is ordered

## 2018-04-19 ENCOUNTER — Telehealth: Payer: Self-pay

## 2018-04-19 LAB — PSA: Prostate Specific Ag, Serum: 4 ng/mL (ref 0.0–4.0)

## 2018-04-19 NOTE — Telephone Encounter (Signed)
-----   Message from Nori Riis, PA-C sent at 04/19/2018  4:43 PM EDT ----- Please let Shane Abbott know that his PSA is still at 4.0.  I would like him to come in the next two weeks, so that we can get another x-ray and talk about his PSA.

## 2018-04-19 NOTE — Telephone Encounter (Signed)
Pt informed, he will call our office next week to schedule.

## 2018-04-21 ENCOUNTER — Telehealth: Payer: Self-pay | Admitting: Urology

## 2018-04-21 NOTE — Telephone Encounter (Signed)
Patient called and stated that he went to [facility] on 04-11-18 and had a CT scan and it showed a 6 mm stone and he wants to know if he can have to blasted? He said he was not in any pain but he can feel it moving and wants to do something about it.   Sharyn Lull

## 2018-04-21 NOTE — Telephone Encounter (Signed)
Yes.  Schedule him for ESWL.

## 2018-04-21 NOTE — Telephone Encounter (Signed)
That would be fine.  Please have [name] schedule him for right ESWL.

## 2018-04-21 NOTE — Telephone Encounter (Signed)
Patient will come to office 04/24/2018 to fill out ESWL paperwork & provide a urine sample for culture prior to procedure planned for 04/27/2018.

## 2018-04-21 NOTE — Telephone Encounter (Signed)
[name], See the message below from [facility], patient needs to be set up for ESWL and can you please call him back to let him know what we are doing?   Thanks,  Sharyn Lull

## 2018-04-24 ENCOUNTER — Other Ambulatory Visit: Payer: Self-pay | Admitting: Radiology

## 2018-04-24 ENCOUNTER — Other Ambulatory Visit: Payer: BLUE CROSS/BLUE SHIELD

## 2018-04-24 DIAGNOSIS — N201 Calculus of ureter: Secondary | ICD-10-CM

## 2018-04-24 LAB — URINALYSIS, COMPLETE
Bilirubin, UA: NEGATIVE
Glucose, UA: NEGATIVE
KETONES UA: NEGATIVE
NITRITE UA: NEGATIVE
Protein, UA: NEGATIVE
SPEC GRAV UA: 1.01 (ref 1.005–1.030)
Urobilinogen, Ur: 0.2 mg/dL (ref 0.2–1.0)
pH, UA: 6 (ref 5.0–7.5)

## 2018-04-24 LAB — MICROSCOPIC EXAMINATION

## 2018-04-25 ENCOUNTER — Telehealth: Payer: Self-pay | Admitting: Radiology

## 2018-04-25 NOTE — Telephone Encounter (Signed)
Contacted patient regarding [employer] has terminated on 04/20/2018 & to get new insurance information. Patient states he thought Medicare will not go into effect until his birthdate 05/01/2016. Patient is going to confirm that Medicare is active & notify the office. ESWL will be postponed 1 week to give patient time to confirm. Questions answered. Patient voices understanding.

## 2018-04-26 LAB — CULTURE, URINE COMPREHENSIVE

## 2018-04-27 ENCOUNTER — Ambulatory Visit: Admission: RE | Admit: 2018-04-27 | Payer: BLUE CROSS/BLUE SHIELD | Source: Ambulatory Visit | Admitting: Urology

## 2018-04-27 ENCOUNTER — Encounter: Admission: RE | Payer: Self-pay | Source: Ambulatory Visit

## 2018-04-27 SURGERY — LITHOTRIPSY, ESWL
Anesthesia: Moderate Sedation | Laterality: Right

## 2018-05-03 ENCOUNTER — Other Ambulatory Visit: Payer: Self-pay | Admitting: Radiology

## 2018-05-03 DIAGNOSIS — N201 Calculus of ureter: Secondary | ICD-10-CM

## 2018-05-04 ENCOUNTER — Ambulatory Visit
Admission: RE | Admit: 2018-05-04 | Discharge: 2018-05-04 | Disposition: A | Discharge status: Home / Self Care | Payer: Medicare Other | Source: Ambulatory Visit | Attending: Urology | Admitting: Urology

## 2018-05-04 ENCOUNTER — Ambulatory Visit: Payer: Medicare Other

## 2018-05-04 ENCOUNTER — Encounter
Admission: RE | Disposition: A | Discharge status: Home / Self Care | Payer: Self-pay | Source: Ambulatory Visit | Attending: Urology

## 2018-05-04 DIAGNOSIS — N201 Calculus of ureter: Secondary | ICD-10-CM | POA: Diagnosis not present

## 2018-05-04 HISTORY — DX: Personal history of urinary calculi: Z87.442

## 2018-05-04 HISTORY — PX: EXTRACORPOREAL SHOCK WAVE LITHOTRIPSY: SHX1557

## 2018-05-04 SURGERY — LITHOTRIPSY, ESWL
Anesthesia: Moderate Sedation | Laterality: Right

## 2018-05-04 MED ORDER — ONDANSETRON HCL 4 MG/2ML IJ SOLN
INTRAMUSCULAR | Status: AC
  Administered 2018-05-04: 4 mg via INTRAVENOUS
  Filled 2018-05-04: qty 2

## 2018-05-04 MED ORDER — CIPROFLOXACIN HCL 500 MG PO TABS
ORAL_TABLET | ORAL | Status: AC
  Administered 2018-05-04: 500 mg via ORAL
  Filled 2018-05-04: qty 1

## 2018-05-04 MED ORDER — CIPROFLOXACIN HCL 500 MG PO TABS
500.0000 mg | ORAL_TABLET | ORAL | Status: AC
  Administered 2018-05-04: 500 mg via ORAL

## 2018-05-04 MED ORDER — DIPHENHYDRAMINE HCL 25 MG PO CAPS
ORAL_CAPSULE | ORAL | Status: AC
  Administered 2018-05-04: 25 mg via ORAL
  Filled 2018-05-04: qty 1

## 2018-05-04 MED ORDER — DIAZEPAM 5 MG PO TABS
10.0000 mg | ORAL_TABLET | ORAL | Status: AC
  Administered 2018-05-04: 10 mg via ORAL

## 2018-05-04 MED ORDER — DIPHENHYDRAMINE HCL 25 MG PO CAPS
25.0000 mg | ORAL_CAPSULE | ORAL | Status: AC
  Administered 2018-05-04: 25 mg via ORAL

## 2018-05-04 MED ORDER — DIAZEPAM 5 MG PO TABS
ORAL_TABLET | ORAL | Status: AC
  Administered 2018-05-04: 10 mg via ORAL
  Filled 2018-05-04: qty 2

## 2018-05-04 MED ORDER — ONDANSETRON HCL 4 MG/2ML IJ SOLN
4.0000 mg | Freq: Once | INTRAMUSCULAR | Status: AC | PRN
  Administered 2018-05-04: 4 mg via INTRAVENOUS

## 2018-05-04 MED ORDER — SODIUM CHLORIDE 0.9 % IV SOLN
INTRAVENOUS | Status: DC
  Administered 2018-05-04: 07:00:00 via INTRAVENOUS

## 2018-05-04 NOTE — Discharge Instructions (Addendum)
AMBULATORY SURGERY  °DISCHARGE INSTRUCTIONS ° ° °1) The drugs that you were given will stay in your system until tomorrow so for the next 24 hours you should not: ° °A) Drive an automobile °B) Make any legal decisions °C) Drink any alcoholic beverage ° ° °2) You may resume regular meals tomorrow.  Today it is better to start with liquids and gradually work up to solid foods. ° °You may eat anything you prefer, but it is better to start with liquids, then soup and crackers, and gradually work up to solid foods. ° ° °3) Please notify your doctor immediately if you have any unusual bleeding, trouble breathing, redness and pain at the surgery site, drainage, fever, or pain not relieved by medication. ° ° ° °4) Additional Instructions:   FOLLOW [facility] INSTRUCTION SHEET AS REVIEWED. ° ° ° ° ° ° ° °Please contact your physician with any problems or Same Day Surgery at [phone], Monday through Friday 6 am to 4 pm, or [place] [facility] [place] Main number at [phone]. °

## 2018-05-05 ENCOUNTER — Encounter: Payer: Self-pay | Admitting: Urology

## 2018-05-11 ENCOUNTER — Ambulatory Visit: Payer: BLUE CROSS/BLUE SHIELD | Admitting: Urology

## 2018-05-24 ENCOUNTER — Encounter: Payer: Self-pay | Admitting: Urology

## 2018-05-24 ENCOUNTER — Ambulatory Visit
Admission: RE | Admit: 2018-05-24 | Discharge: 2018-05-24 | Disposition: A | Discharge status: Home / Self Care | Payer: Medicare Other | Source: Ambulatory Visit | Attending: Urology | Admitting: Urology

## 2018-05-24 ENCOUNTER — Ambulatory Visit (INDEPENDENT_AMBULATORY_CARE_PROVIDER_SITE_OTHER): Payer: Medicare Other | Admitting: Urology

## 2018-05-24 VITALS — BP 128/87 | HR 64 | Ht 72.0 in | Wt 181.0 lb

## 2018-05-24 DIAGNOSIS — N132 Hydronephrosis with renal and ureteral calculous obstruction: Secondary | ICD-10-CM

## 2018-05-24 DIAGNOSIS — Z87442 Personal history of urinary calculi: Secondary | ICD-10-CM

## 2018-05-24 DIAGNOSIS — R3129 Other microscopic hematuria: Secondary | ICD-10-CM

## 2018-05-24 DIAGNOSIS — N201 Calculus of ureter: Secondary | ICD-10-CM

## 2018-05-24 NOTE — Progress Notes (Signed)
05/24/2018 2:45 PM   Shane Abbott October 31, 1950 [id number]  Referring provider: Glendon Axe, MD [place] [facility] [place], [place] [address]  No chief complaint on file.   HPI: Patient is a 67 year old African American male with a history of nephrolithiasis who is status post ESWL who presents today for follow-up  CT Renal stone study on 04/11/2018 noted 6 mm mid right ureteral stone with mild right hydroureteronephrosis and trace perinephric stranding. He was given Percocet and Flomax in the [facility] ER.    He underwent right ESWL on 05/04/2018.  His postprocedural course was uneventful and as expected.    KUB today (05/24/2018) noted no stones.    He brings in fragments for analysis.  He is on any further flank pain.  Patient denies any gross hematuria, dysuria or suprapubic/flank pain.  Patient denies any fevers, chills, nausea or vomiting.     PMH: Past Medical History:  Diagnosis Date  . Allergy    Seasonal  . Asthma    as a child  . Elevated PSA   . History of kidney stones   . Hyperlipidemia   . Kidney stone     Surgical History: Past Surgical History:  Procedure Laterality Date  . EXTRACORPOREAL SHOCK WAVE LITHOTRIPSY  2001  . EXTRACORPOREAL SHOCK WAVE LITHOTRIPSY Right 05/04/2018   Procedure: EXTRACORPOREAL SHOCK WAVE LITHOTRIPSY (ESWL);  Surgeon: Abbie Sons, MD;  Location: [facility] ORS;  Service: Urology;  Laterality: Right;  . HERNIA REPAIR  1994   Right Inguinal- [facility] Medications:  Allergies as of 05/24/2018   No Known Allergies     Medication List        Accurate as of 05/24/18  2:45 PM. Always use your most recent med list.          MULTI-VITAMINS Tabs Take 1 tablet by mouth daily.   oxyCODONE-acetaminophen 5-325 MG tablet Commonly known as:  PERCOCET/ROXICET Take 1 tablet by mouth every 8 (eight) hours as needed. for pain   sodium chloride 0.9 % nebulizer solution 5 ML ampule to  be used off label for Scleral Contact lens insertion and rinsing.   Use as directed   tamsulosin 0.4 MG Caps capsule Commonly known as:  FLOMAX Take by mouth.       Allergies: No Known Allergies  Family History: Family History  Problem Relation Age of Onset  . Healthy Father   . Heart disease Mother   . Healthy Sister   . Healthy Sister   . Heart attack Brother   . Prostate cancer Neg Hx   . Kidney cancer Neg Hx   . Bladder Cancer Neg Hx   . Kidney disease Neg Hx     Social History:  reports that he has never smoked. He has never used smokeless tobacco. He reports that he drinks alcohol. He reports that he does not use drugs.  ROS: UROLOGY Frequent Urination?: No Hard to postpone urination?: No Burning/pain with urination?: No Get up at night to urinate?: No Leakage of urine?: No Urine stream starts and stops?: No Trouble starting stream?: No Do you have to strain to urinate?: No Blood in urine?: No Urinary tract infection?: No Sexually transmitted disease?: No Injury to kidneys or bladder?: No Painful intercourse?: No Weak stream?: No Erection problems?: No Penile pain?: No  Gastrointestinal Nausea?: No Vomiting?: No Indigestion/heartburn?: No Diarrhea?: No Constipation?: No  Constitutional Fever: No Night sweats?: No Weight loss?: No  Fatigue?: No  Skin Skin rash/lesions?: No Itching?: No  Eyes Blurred vision?: No Double vision?: No  Ears/Nose/Throat Sore throat?: No Sinus problems?: No  Hematologic/Lymphatic Swollen glands?: No Easy bruising?: No  Cardiovascular Leg swelling?: No Chest pain?: No  Respiratory Cough?: No Shortness of breath?: No  Endocrine Excessive thirst?: No  Musculoskeletal Back pain?: No Joint pain?: No  Neurological Headaches?: No Dizziness?: No  Psychologic Depression?: No Anxiety?: No  Physical Exam: BP 128/87 (BP Location: Left Arm, Patient Position: Sitting)   Pulse 64   Ht 6' (1.829 m)    Wt 181 lb (82.1 kg)   BMI 24.55 kg/m   Constitutional:  Well nourished. Alert and oriented, No acute distress. HEENT: [place] AT, moist mucus membranes.  Trachea midline, no masses. Cardiovascular: No clubbing, cyanosis, or edema. Respiratory: Normal respiratory effort, no increased work of breathing. Skin: No rashes, bruises or suspicious lesions. Lymph: No cervical or inguinal adenopathy. Neurologic: Grossly intact, no focal deficits, moving all 4 extremities. Psychiatric: Normal mood and affect.  Laboratory Data: No results found for: WBC, HGB, HCT, MCV, PLT  No results found for: CREATININE  No results found for: PSA  No results found for: TESTOSTERONE  No results found for: HGBA1C  No results found for: TSH  No results found for: CHOL, HDL, CHOLHDL, VLDL, LDLCALC  No results found for: AST No results found for: ALT No components found for: ALKALINEPHOPHATASE No components found for: BILIRUBINTOTAL  No results found for: ESTRADIOL  Urinalysis    Component Value Date/Time   APPEARANCEUR Clear 04/24/2018 0830   GLUCOSEU Negative 04/24/2018 0830   BILIRUBINUR Negative 04/24/2018 0830   PROTEINUR Negative 04/24/2018 0830   NITRITE Negative 04/24/2018 0830   LEUKOCYTESUR Trace 04/24/2018 0830    I have reviewed the labs.   Pertinent Imaging: CLINICAL DATA:  Right ureteral stone.  EXAM: ABDOMEN - 1 VIEW  COMPARISON:  Radiographs dated 05/04/2018 and 04/18/2018  FINDINGS: There are no visible renal calculi. There are tiny calcifications in the right side of the pelvis consistent with phleboliths, unchanged since 04/18/2018. The stone seen in the mid right ureter on the study of 04/18/2018 is not apparent on this exam.  Normal bowel gas pattern.  No acute bone abnormality.  IMPRESSION: No visible renal or ureteral calculi.  No acute abnormalities.   Electronically Signed   By: Lorriane Shire M.D.   On: 05/24/2018 17:51 I have independently reviewed  the films.    Assessment & Plan:    1. Right ureteral stone stone sent for analysis   2. Right hydronephrosis Obtain RUS to ensure the hydronephrosis has resolved once they have passed and/or recovered from procedure to ensure to iatrogenic hydronephrosis does not remain - it is explained to the patient that it is important to document resolution of the hydronephrosis as "silent hydronephrosis" can occur and cause damage and/or loss of the kidney  3. Microscopic hematuria Continue to monitor the patient's UA after the treatment/passage of the stone to ensure the hematuria has resolved If hematuria persists, we will pursue a hematuria workup with CT Urogram and cystoscopy if appropriate.   Return in about 1 month (around 06/23/2018) for PSA only .  These notes generated with voice recognition software. I apologize for typographical errors.  Zara Council, PA-C  [facility] [address]  [place], [place] [address] [phone]

## 2018-05-30 ENCOUNTER — Ambulatory Visit
Admission: RE | Admit: 2018-05-30 | Discharge: 2018-05-30 | Disposition: A | Discharge status: Home / Self Care | Payer: Medicare Other | Source: Ambulatory Visit | Attending: Urology | Admitting: Urology

## 2018-05-30 DIAGNOSIS — R3129 Other microscopic hematuria: Secondary | ICD-10-CM

## 2018-05-30 DIAGNOSIS — N132 Hydronephrosis with renal and ureteral calculous obstruction: Secondary | ICD-10-CM | POA: Diagnosis present

## 2018-05-31 ENCOUNTER — Telehealth: Payer: Self-pay

## 2018-05-31 ENCOUNTER — Other Ambulatory Visit: Payer: Self-pay | Admitting: Urology

## 2018-05-31 NOTE — Telephone Encounter (Signed)
Patient notified

## 2018-05-31 NOTE — Telephone Encounter (Signed)
-----   Message from Nori Riis, PA-C sent at 05/31/2018  5:46 AM EDT ----- Please let Mr. Geister know that his renal ultrasound was normal.

## 2018-06-28 ENCOUNTER — Other Ambulatory Visit: Payer: Medicare Other

## 2018-07-04 ENCOUNTER — Telehealth: Payer: Self-pay | Admitting: Urology

## 2018-07-04 NOTE — Telephone Encounter (Signed)
Please call Mr. Shane Abbott and remind him that we need to recheck his PSA at this time.

## 2018-07-06 NOTE — Telephone Encounter (Signed)
LMOM for patient to call and schedule a Lab visit for PSA

## 2018-09-05 ENCOUNTER — Telehealth: Payer: Self-pay | Admitting: Urology

## 2018-09-05 NOTE — Telephone Encounter (Signed)
Shane Abbott is due for his 6 months follow up.  Would you please schedule him for an appointment?  He will need a PSA drawn prior.  It is fine to schedule him after the first of the year.

## 2018-09-07 NOTE — Telephone Encounter (Signed)
I called to schedule but his phone went straight to voicemail.  Left a message for him to call back to schedule his 6 month follow up with PSA prior   Sharyn Lull

## 2018-09-26 ENCOUNTER — Encounter: Payer: Self-pay | Admitting: Urology

## 2018-11-28 ENCOUNTER — Telehealth: Payer: Self-pay | Admitting: Urology

## 2018-11-28 NOTE — Telephone Encounter (Signed)
Would you call Mr. Shane Abbott and schedule an appointment with me?  It is time to recheck his PSA.

## 2018-11-28 NOTE — Telephone Encounter (Signed)
LMOM for pt to call office to schedule f/u appt w/PSA prior.

## 2019-05-16 NOTE — Telephone Encounter (Signed)
Would you schedule Shane Abbott for a follow up visit?  He will need a PSA prior to his appointment.

## 2019-05-17 NOTE — Telephone Encounter (Signed)
I LMOM for pt to call back to set up appt.

## 2019-07-31 ENCOUNTER — Other Ambulatory Visit: Payer: Self-pay

## 2019-07-31 DIAGNOSIS — Z20822 Contact with and (suspected) exposure to covid-19: Secondary | ICD-10-CM

## 2019-08-02 LAB — NOVEL CORONAVIRUS, NAA: SARS-CoV-2, NAA: NOT DETECTED

## 2019-08-08 ENCOUNTER — Encounter: Payer: Self-pay | Admitting: Urology

## 2019-09-17 ENCOUNTER — Telehealth: Payer: Self-pay | Admitting: Urology

## 2019-09-17 NOTE — Telephone Encounter (Signed)
This patient called today and stated that his ins will now require him to see an MD and he asked to change. I have scheduled his follow up with Sninksy and labs prior. Just wanted you to be aware that he has finally scheduled his follow up. And for Sninksy to know why he is now in his schedule.  thanks

## 2019-10-02 ENCOUNTER — Other Ambulatory Visit: Payer: Self-pay

## 2019-10-02 ENCOUNTER — Other Ambulatory Visit: Payer: Self-pay | Admitting: *Deleted

## 2019-10-02 ENCOUNTER — Other Ambulatory Visit: Payer: Medicare HMO

## 2019-10-02 DIAGNOSIS — R972 Elevated prostate specific antigen [PSA]: Secondary | ICD-10-CM

## 2019-10-02 DIAGNOSIS — N138 Other obstructive and reflux uropathy: Secondary | ICD-10-CM

## 2019-10-03 LAB — PSA: Prostate Specific Ag, Serum: 3.5 ng/mL (ref 0.0–4.0)

## 2019-10-10 ENCOUNTER — Encounter: Payer: Self-pay | Admitting: Urology

## 2019-10-10 ENCOUNTER — Other Ambulatory Visit: Payer: Self-pay

## 2019-10-10 ENCOUNTER — Ambulatory Visit: Payer: Medicare HMO | Admitting: Urology

## 2019-10-10 VITALS — BP 157/82 | HR 75 | Ht 73.0 in | Wt 175.0 lb

## 2019-10-10 DIAGNOSIS — N529 Male erectile dysfunction, unspecified: Secondary | ICD-10-CM | POA: Diagnosis not present

## 2019-10-10 DIAGNOSIS — Z125 Encounter for screening for malignant neoplasm of prostate: Secondary | ICD-10-CM | POA: Diagnosis not present

## 2019-10-10 MED ORDER — TADALAFIL 5 MG PO TABS: 5.0000 mg | ORAL_TABLET | Freq: Every day | ORAL | 6 refills | Status: DC | PRN

## 2019-10-10 NOTE — Progress Notes (Signed)
   10/10/2019 10:12 AM   Leodis Sias 01/02/1951 [id number]  Reason for visit: Yearly PSA screening, ED  HPI: I saw Mr. Grandville Silos in urology clinic for routine PSA screening in ED.  He was previously followed by Zara Council, PA in our clinic.  He is a healthy 69 year old male with no family history of prostate cancer who has a baseline PSA of approximately 3.5.  He has a history of nephrolithiasis and history of shockwave lithotripsy.  Most recent PSA on 10/02/2019 was stable at 3.5 from prior.  He reports some new trouble with erections over the last few months and is interested in trying medications for this.  He does not take any nitrates for chest pain.  He has never tried medications for ED prior.  He denies any gross hematuria or urinary symptoms.  We reviewed the AUA guidelines that recommend routine PSA screening in men age 31-69, as well as the risks and benefits of screening.  We also reviewed PD5 inhibitors as a first-line treatment for erectile dysfunction, and we reviewed the risks and benefits of Cialis, and a good Rx coupon was provided.  Trial of Cialis 5 mg as needed for ED RTC 1 year for routine PSA screening  A total of 30 minutes were spent face-to-face with the patient, greater than 50% was spent in patient education, counseling, and coordination of care regarding prostate cancer screening and erectile dysfunction.  B[place]ley Co, [facility] [address], [place], [place] [address] [phone]

## 2019-10-10 NOTE — Patient Instructions (Signed)
Tadalafil tablets (Cialis) What is this medicine? TADALAFIL (tah DA la fil) is used to treat erection problems in men. It is also used for enlargement of the prostate gland in men, a condition called benign prostatic hyperplasia or BPH. This medicine improves urine flow and reduces BPH symptoms. This medicine can also treat both erection problems and BPH when they occur together. This medicine may be used for other purposes; ask your health care provider or pharmacist if you have questions. COMMON BRAND NAME(S): Adcirca, ALYQ, Cialis What should I tell my health care provider before I take this medicine? They need to know if you have any of these conditions:  bleeding disorders  eye or vision problems, including a rare inherited eye disease called retinitis pigmentosa  anatomical deformation of the penis, Peyronie's disease, or history of priapism (painful and prolonged erection)  heart disease, angina, a history of heart attack, irregular heart beats, or other heart problems  high or low blood pressure  history of blood diseases, like sickle cell anemia or leukemia  history of stomach bleeding  kidney disease  liver disease  stroke  an unusual or allergic reaction to tadalafil, other medicines, foods, dyes, or preservatives  pregnant or trying to get pregnant  breast-feeding How should I use this medicine? Take this medicine by mouth with a glass of water. Follow the directions on the prescription label. You may take this medicine with or without meals. When this medicine is used for erection problems, your doctor may prescribe it to be taken once daily or as needed. If you are taking the medicine as needed, you may be able to have sexual activity 30 minutes after taking it and for up to 36 hours after taking it. Whether you are taking the medicine as needed or once daily, you should not take more than one dose per day. If you are taking this medicine for symptoms of benign  prostatic hyperplasia (BPH) or to treat both BPH and an erection problem, take the dose once daily at about the same time each day. Do not take your medicine more often than directed. Talk to your pediatrician regarding the use of this medicine in children. Special care may be needed. Overdosage: If you think you have taken too much of this medicine contact a poison control center or emergency room at once. NOTE: This medicine is only for you. Do not share this medicine with others. What if I miss a dose? If you are taking this medicine as needed for erection problems, this does not apply. If you miss a dose while taking this medicine once daily for an erection problem, benign prostatic hyperplasia, or both, take it as soon as you remember, but do not take more than one dose per day. What may interact with this medicine? Do not take this medicine with any of the following medications:  nitrates like amyl nitrite, isosorbide dinitrate, isosorbide mononitrate, nitroglycerin  other medicines for erectile dysfunction like avanafil, sildenafil, vardenafil  other tadalafil products (Adcirca)  riociguat This medicine may also interact with the following medications:  certain drugs for high blood pressure  certain drugs for the treatment of HIV infection or AIDS  certain drugs used for fungal or yeast infections, like fluconazole, itraconazole, ketoconazole, and voriconazole  certain drugs used for seizures like carbamazepine, phenytoin, and phenobarbital  grapefruit juice  macrolide antibiotics like clarithromycin, erythromycin, troleandomycin  medicines for prostate problems  rifabutin, rifampin or rifapentine This list may not describe all possible interactions. Give your   health care provider a list of all the medicines, herbs, non-prescription drugs, or dietary supplements you use. Also tell them if you smoke, drink alcohol, or use illegal drugs. Some items may interact with your  medicine. What should I watch for while using this medicine? If you notice any changes in your vision while taking this drug, call your doctor or health care professional as soon as possible. Stop using this medicine and call your health care provider right away if you have a loss of sight in one or both eyes. Contact your doctor or health care professional right away if the erection lasts longer than 4 hours or if it becomes painful. This may be a sign of serious problem and must be treated right away to prevent permanent damage. If you experience symptoms of nausea, dizziness, chest pain or arm pain upon initiation of sexual activity after taking this medicine, you should refrain from further activity and call your doctor or health care professional as soon as possible. Do not drink alcohol to excess (examples, 5 glasses of wine or 5 shots of whiskey) when taking this medicine. When taken in excess, alcohol can increase your chances of getting a headache or getting dizzy, increasing your heart rate or lowering your blood pressure. Using this medicine does not protect you or your partner against HIV infection (the virus that causes AIDS) or other sexually transmitted diseases. What side effects may I notice from receiving this medicine? Side effects that you should report to your doctor or health care professional as soon as possible:  allergic reactions like skin rash, itching or hives, swelling of the face, lips, or tongue  breathing problems  changes in hearing  changes in vision  chest pain  fast, irregular heartbeat  prolonged or painful erection  seizures Side effects that usually do not require medical attention (report to your doctor or health care professional if they continue or are bothersome):  back pain  dizziness  flushing  headache  indigestion  muscle aches  nausea  stuffy or runny nose This list may not describe all possible side effects. Call your doctor  for medical advice about side effects. You may report side effects to FDA at 1-800-FDA-1088. Where should I keep my medicine? Keep out of the reach of children. Store at room temperature between 15 and 30 degrees C (59 and 86 degrees F). Throw away any unused medicine after the expiration date. NOTE: This sheet is a summary. It may not cover all possible information. If you have questions about this medicine, talk to your doctor, pharmacist, or health care provider.  2020 Elsevier/Gold Standard (2014-01-25 13:15:49)   Prostate Cancer Screening  Prostate cancer screening is a test that is done to check for the presence of prostate cancer in men. The prostate gland is a walnut-sized gland that is located below the bladder and in front of the rectum in males. The function of the prostate is to add fluid to semen during ejaculation. Prostate cancer is the second most common type of cancer in men. Who should have prostate cancer screening?  Screening recommendations vary based on age and other risk factors. Screening is recommended if:  You are older than age 55. If you are age 55-69, talk with your health care provider about your need for screening and how often screening should be done. Because most prostate cancers are slow growing and will not cause death, screening is generally reserved in this age group for men who have a 10-15-year   life expectancy.  You are younger than age 55, and you have these risk factors: ? Being a black male or a male of African descent. ? Having a father, brother, or uncle who has been diagnosed with prostate cancer. The risk is higher if your family member's cancer occurred at an early age. Screening is not recommended if:  You are younger than age 40.  You are between the ages of 40 and 54 and you have no risk factors.  You are 70 years of age or older. At this age, the risks that screening can cause are greater than the benefits that it may provide. If you are  at high risk for prostate cancer, your health care provider may recommend that you have screenings more often or that you start screening at a younger age. How is screening for prostate cancer done? The recommended prostate cancer screening test is a blood test called the prostate-specific antigen (PSA) test. PSA is a protein that is made in the prostate. As you age, your prostate naturally produces more PSA. Abnormally high PSA levels may be caused by:  Prostate cancer.  An enlarged prostate that is not caused by cancer (benign prostatic hyperplasia, BPH). This condition is very common in older men.  A prostate gland infection (prostatitis). Depending on the PSA results, you may need more tests, such as:  A physical exam to check the size of your prostate gland.  Blood and imaging tests.  A procedure to remove tissue samples from your prostate gland for testing (biopsy). What are the benefits of prostate cancer screening?  Screening can help to identify cancer at an early stage, before symptoms start and when the cancer can be treated more easily.  There is a small chance that screening may lower your risk of dying from prostate cancer. The chance is small because prostate cancer is a slow-growing cancer, and most men with prostate cancer die from a different cause. What are the risks of prostate cancer screening? The main risk of prostate cancer screening is diagnosing and treating prostate cancer that would never have caused any symptoms or problems. This is called overdiagnosisand overtreatment. PSA screening cannot tell you if your PSA is high due to cancer or a different cause. A prostate biopsy is the only procedure to diagnose prostate cancer. Even the results of a biopsy may not tell you if your cancer needs to be treated. Slow-growing prostate cancer may not need any treatment other than monitoring, so diagnosing and treating it may cause unnecessary stress or other side effects. A  prostate biopsy may also cause:  Infection or fever.  A false negative. This is a result that shows that you do not have prostate cancer when you actually do have prostate cancer. Questions to ask your health care provider  When should I start prostate cancer screening?  What is my risk for prostate cancer?  How often do I need screening?  What type of screening tests do I need?  How do I get my test results?  What do my results mean?  Do I need treatment? Where to find more information  The American Cancer Society: www.cancer.org  American Urological Association: www.auanet.org Contact a health care provider if:  You have difficulty urinating.  You have pain when you urinate or ejaculate.  You have blood in your urine or semen.  You have pain in your back or in the area of your prostate. Summary  Prostate cancer is a common type of cancer   in men. The prostate gland is located below the bladder and in front of the rectum. This gland adds fluid to semen during ejaculation.  Prostate cancer screening may identify cancer at an early stage, when the cancer can be treated more easily.  The prostate-specific antigen (PSA) test is the recommended screening test for prostate cancer.  Discuss the risks and benefits of prostate cancer screening with your health care provider. If you are age 70 or older, the risks that screening can cause are greater than the benefits that it may provide. This information is not intended to replace advice given to you by your health care provider. Make sure you discuss any questions you have with your health care provider. Document Revised: 04/19/2019 Document Reviewed: 04/19/2019 Elsevier Patient Education  2020 Elsevier Inc.  

## 2019-10-29 ENCOUNTER — Ambulatory Visit: Payer: Medicare HMO | Attending: Internal Medicine

## 2019-10-29 ENCOUNTER — Other Ambulatory Visit: Payer: Self-pay

## 2019-10-29 DIAGNOSIS — Z23 Encounter for immunization: Secondary | ICD-10-CM | POA: Insufficient documentation

## 2019-10-29 NOTE — Progress Notes (Signed)
   Covid-19 Vaccination Clinic  Name:  Shane Abbott    MRN: [id number] DOB: [birth year]  10/29/2019  [name] was observed post Covid-19 immunization for 15 minutes without incidence. He was provided with Vaccine Information Sheet and instruction to access the V-Safe system.   [name] was instructed to call 911 with any severe reactions post vaccine: Marland Kitchen Difficulty breathing  . Swelling of your face and throat  . A fast heartbeat  . A bad rash all over your body  . Dizziness and weakness    Immunizations Administered    Name Date Dose VIS Date Route   Moderna COVID-19 Vaccine 10/29/2019 11:41 AM 0.5 mL 08/21/2019 Intramuscular   Manufacturer: Moderna   Lot: [id number]   [place]VO:[id number]

## 2019-11-28 ENCOUNTER — Ambulatory Visit: Payer: Medicare HMO | Attending: Internal Medicine

## 2019-11-28 DIAGNOSIS — Z23 Encounter for immunization: Secondary | ICD-10-CM | POA: Insufficient documentation

## 2019-11-28 NOTE — Progress Notes (Signed)
   Covid-19 Vaccination Clinic  Name:  Shane Abbott    MRN: [id number] DOB: July 15, 1951  11/28/2019  Mr. Shane Abbott was observed post Covid-19 immunization for 15 minutes without incident. He was provided with Vaccine Information Sheet and instruction to access the V-Safe system.   Mr. Shane Abbott was instructed to call 911 with any severe reactions post vaccine: Marland Kitchen Difficulty breathing  . Swelling of face and throat  . A fast heartbeat  . A bad rash all over body  . Dizziness and weakness   Immunizations Administered    Name Date Dose VIS Date Route   Moderna COVID-19 Vaccine 11/28/2019  9:50 AM 0.5 mL 08/21/2019 Intramuscular   Manufacturer: Moderna   Lot: [id number]   [place]:[id number]

## 2020-02-04 ENCOUNTER — Other Ambulatory Visit: Payer: Self-pay | Admitting: Physician Assistant

## 2020-02-04 ENCOUNTER — Ambulatory Visit
Admission: RE | Admit: 2020-02-04 | Discharge: 2020-02-04 | Disposition: A | Discharge status: Home / Self Care | Payer: Medicare HMO | Source: Ambulatory Visit | Attending: Physician Assistant | Admitting: Physician Assistant

## 2020-02-04 ENCOUNTER — Other Ambulatory Visit: Payer: Self-pay

## 2020-02-04 DIAGNOSIS — M7989 Other specified soft tissue disorders: Secondary | ICD-10-CM

## 2020-03-17 NOTE — Progress Notes (Signed)
Certified Letter  

## 2020-04-13 IMAGING — CR DG ABDOMEN 1V
1 series · 2 of 2 positions shown · non-contrast
Comparison: April 18, 2018

CLINICAL DATA: Pre-procedure prior to lithotripsy. Right-sided
stone.

EXAM:
ABDOMEN - 1 VIEW

[Series 1: t abdomen supine · 0.14mm/px · 2 of 2 slices shown]
[im 1/2]
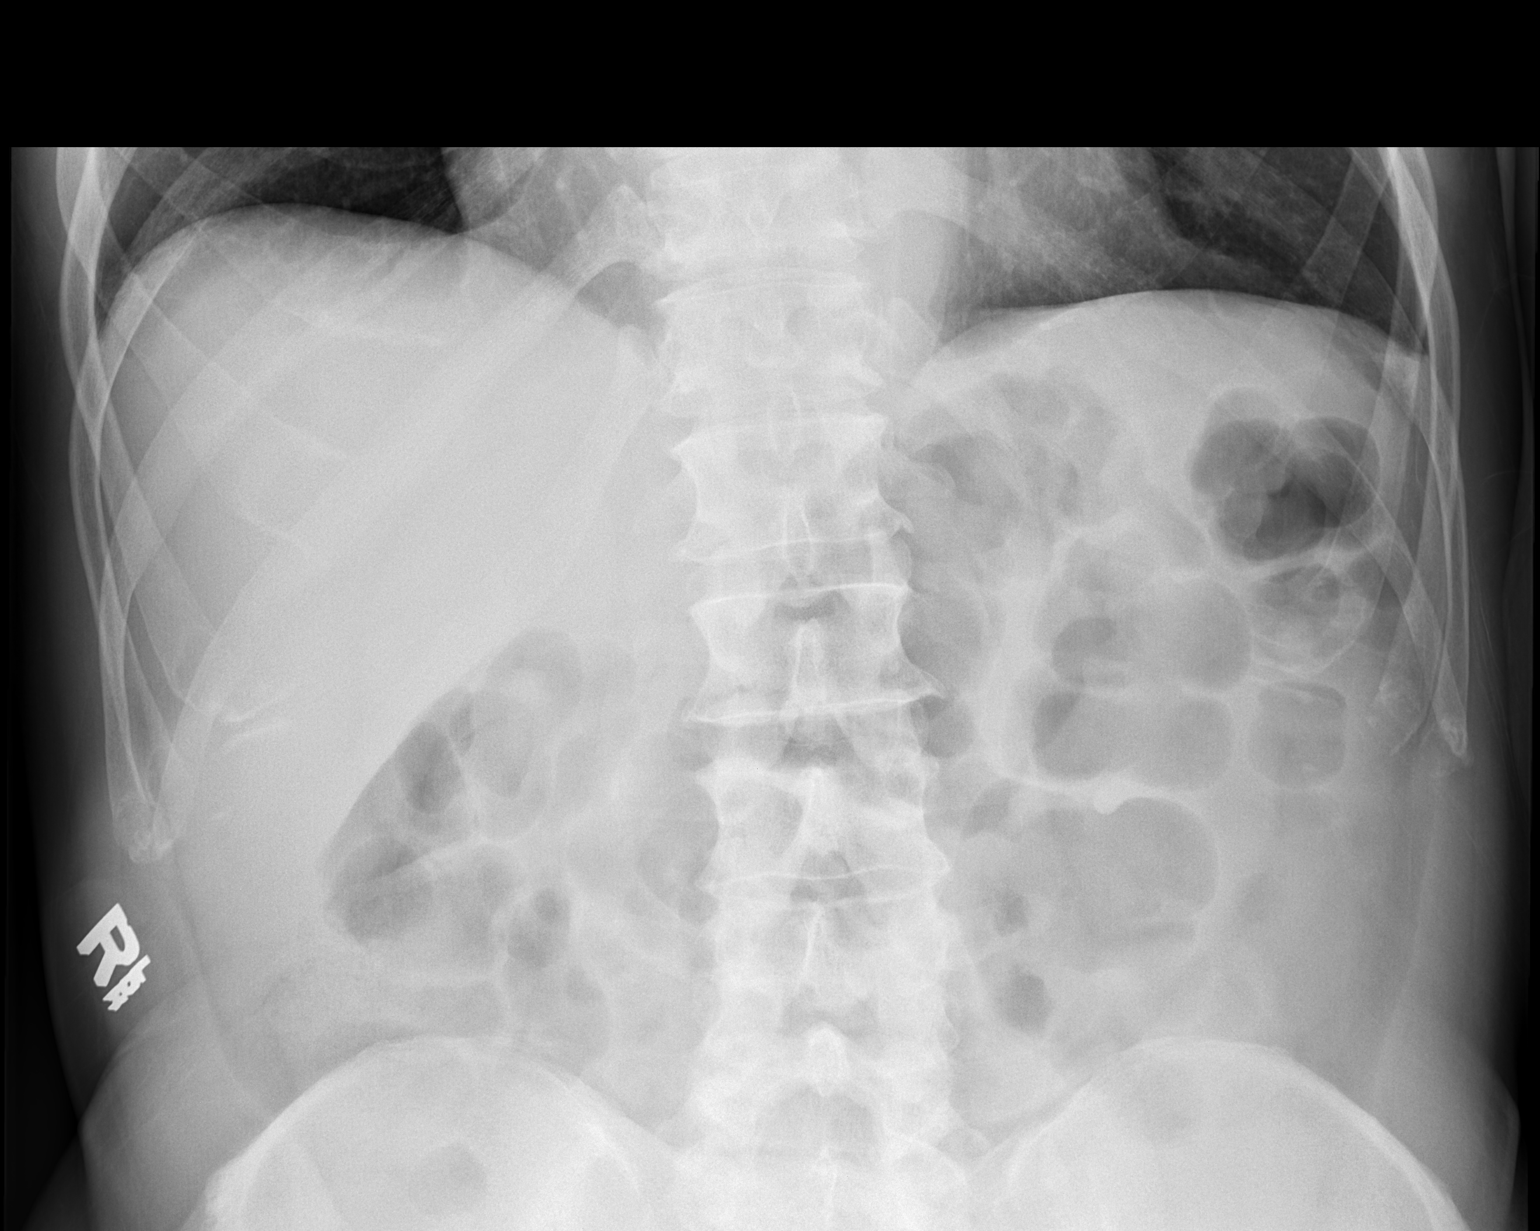
[im 2/2]
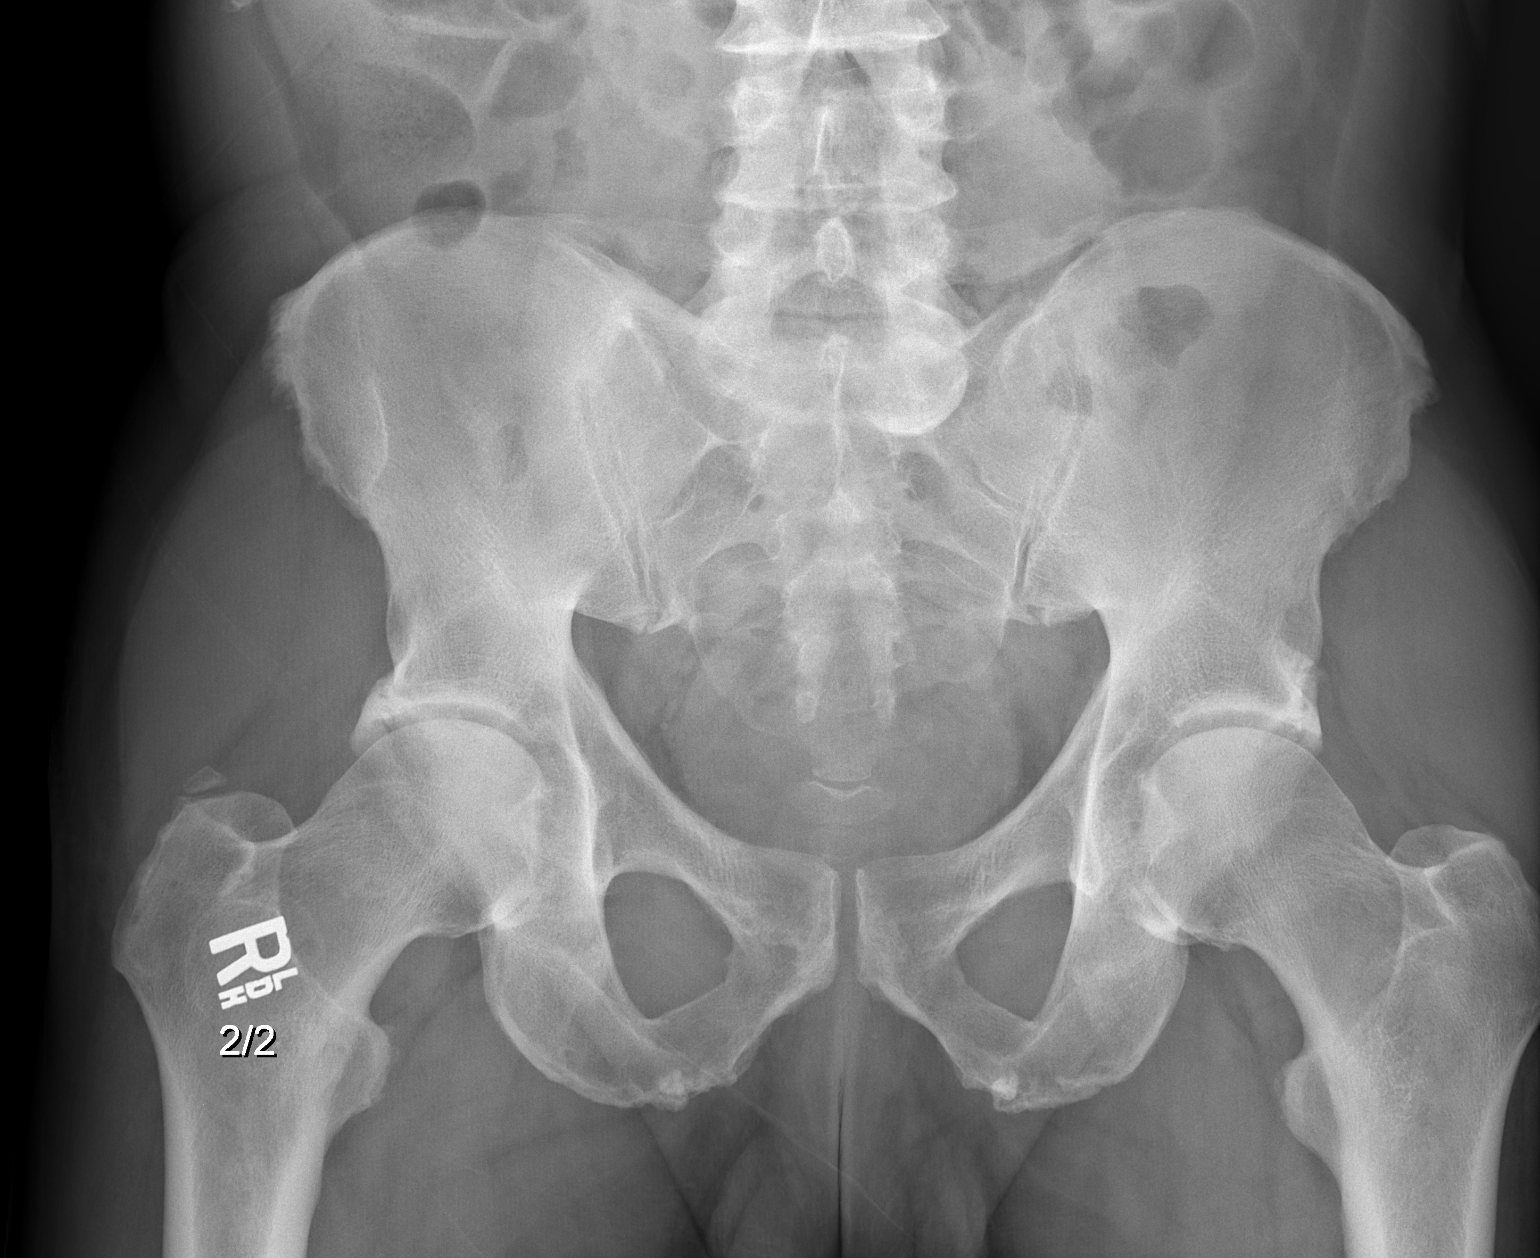

[2 of 2 positions shown; findings below may reference images not displayed]

FINDINGS: Both kidneys are partially obscured by bowel gas. No renal stones
noted. The calcification to the right of L3-4 on the previous study
is not definitively identified today. However, this region is
obscured by bowel gas. A tiny calcification in the lateral right
pelvis is stable, possibly a tiny phlebolith. No other interval
changes.
IMPRESSION: 1. The right ureteral stone seen previously is not identified today
with certainty. However, this region is obscured by bowel contents.
No definitive renal stones.

## 2020-10-06 ENCOUNTER — Other Ambulatory Visit: Payer: Medicare HMO

## 2020-10-07 ENCOUNTER — Other Ambulatory Visit: Payer: Self-pay

## 2020-10-07 ENCOUNTER — Other Ambulatory Visit: Payer: Medicare HMO

## 2020-10-07 DIAGNOSIS — N138 Other obstructive and reflux uropathy: Secondary | ICD-10-CM

## 2020-10-07 DIAGNOSIS — Z125 Encounter for screening for malignant neoplasm of prostate: Secondary | ICD-10-CM

## 2020-10-08 LAB — PSA: Prostate Specific Ag, Serum: 6.2 ng/mL — ABNORMAL HIGH (ref 0.0–4.0)

## 2020-10-09 ENCOUNTER — Other Ambulatory Visit: Payer: Self-pay

## 2020-10-09 ENCOUNTER — Encounter: Payer: Self-pay | Admitting: Urology

## 2020-10-09 ENCOUNTER — Ambulatory Visit: Payer: Medicare HMO | Admitting: Urology

## 2020-10-09 VITALS — BP 158/93 | HR 90 | Ht 71.0 in | Wt 185.0 lb

## 2020-10-09 DIAGNOSIS — Z125 Encounter for screening for malignant neoplasm of prostate: Secondary | ICD-10-CM

## 2020-10-09 DIAGNOSIS — R972 Elevated prostate specific antigen [PSA]: Secondary | ICD-10-CM | POA: Diagnosis not present

## 2020-10-09 DIAGNOSIS — N200 Calculus of kidney: Secondary | ICD-10-CM | POA: Insufficient documentation

## 2020-10-09 NOTE — Progress Notes (Signed)
   10/09/2020 10:20 AM   Shane Abbott 12/26/1950 [id number]  Reason for visit: Follow up PSA screening  HPI: I saw Shane Abbott back in urology clinic for routine PSA screening.  He is a very healthy 70 year old African-American male with no family history of breast or prostate cancer.  His baseline PSA has ranged from 3.5-4.  He previously tried Cialis for ED, but is no longer needing the Cialis and is getting good erections on his own.  PSA this year increased to 6.2 on 10/07/2020.  He denies any new urinary symptoms or changes in his health.  DRE today 50 g, smooth, no nodules or masses.  We reviewed the implications of an elevated PSA and the uncertainty surrounding it. In general, a man's PSA increases with age and is produced by both normal and cancerous prostate tissue. The differential diagnosis for elevated PSA includes BPH, prostate cancer, infection, recent intercourse/ejaculation, recent urethroscopic manipulation (foley placement/cystoscopy) or trauma, and prostatitis.   Management of an elevated PSA can include observation or prostate biopsy and we discussed this in detail. Our goal is to detect clinically significant prostate cancers, and manage with either active surveillance, surgery, or radiation for localized disease. Risks of prostate biopsy include bleeding, infection (including life threatening sepsis), pain, and lower urinary symptoms. Hematuria, hematospermia, and blood in the stool are all common after biopsy and can persist up to 4 weeks.   Repeat PSA in 2 weeks with reflex to free, call with results, pursue biopsy if remains elevated  Billey Co, MD  [facility] [address] [place], [place] [address] [phone]

## 2020-10-09 NOTE — Patient Instructions (Signed)
Prostate Cancer Screening  Prostate cancer screening is a test that is done to check for the presence of prostate cancer in men. The prostate gland is a walnut-sized gland that is located below the bladder and in front of the rectum in males. The function of the prostate is to add fluid to semen during ejaculation. Prostate cancer is the second most common type of cancer in men. Who should have prostate cancer screening?  Screening recommendations vary based on age and other risk factors. Screening is recommended if:  You are older than age 55. If you are age 55-69, talk with your health care provider about your need for screening and how often screening should be done. Because most prostate cancers are slow growing and will not cause death, screening is generally reserved in this age group for men who have a 10-15-year life expectancy.  You are younger than age 55, and you have these risk factors: ? Being a black male or a male of African descent. ? Having a father, brother, or uncle who has been diagnosed with prostate cancer. The risk is higher if your family member's cancer occurred at an early age. Screening is not recommended if:  You are younger than age 40.  You are between the ages of 40 and 54 and you have no risk factors.  You are 70 years of age or older. At this age, the risks that screening can cause are greater than the benefits that it may provide. If you are at high risk for prostate cancer, your health care provider may recommend that you have screenings more often or that you start screening at a younger age. How is screening for prostate cancer done? The recommended prostate cancer screening test is a blood test called the prostate-specific antigen (PSA) test. PSA is a protein that is made in the prostate. As you age, your prostate naturally produces more PSA. Abnormally high PSA levels may be caused by:  Prostate cancer.  An enlarged prostate that is not caused by cancer  (benign prostatic hyperplasia, BPH). This condition is very common in older men.  A prostate gland infection (prostatitis). Depending on the PSA results, you may need more tests, such as:  A physical exam to check the size of your prostate gland.  Blood and imaging tests.  A procedure to remove tissue samples from your prostate gland for testing (biopsy). What are the benefits of prostate cancer screening?  Screening can help to identify cancer at an early stage, before symptoms start and when the cancer can be treated more easily.  There is a small chance that screening may lower your risk of dying from prostate cancer. The chance is small because prostate cancer is a slow-growing cancer, and most men with prostate cancer die from a different cause. What are the risks of prostate cancer screening? The main risk of prostate cancer screening is diagnosing and treating prostate cancer that would never have caused any symptoms or problems. This is called overdiagnosisand overtreatment. PSA screening cannot tell you if your PSA is high due to cancer or a different cause. A prostate biopsy is the only procedure to diagnose prostate cancer. Even the results of a biopsy may not tell you if your cancer needs to be treated. Slow-growing prostate cancer may not need any treatment other than monitoring, so diagnosing and treating it may cause unnecessary stress or other side effects. A prostate biopsy may also cause:  Infection or fever.  A false negative. This is   a result that shows that you do not have prostate cancer when you actually do have prostate cancer. Questions to ask your health care provider  When should I start prostate cancer screening?  What is my risk for prostate cancer?  How often do I need screening?  What type of screening tests do I need?  How do I get my test results?  What do my results mean?  Do I need treatment? Where to find more information  The American Cancer  Society: www.cancer.org  American Urological Association: www.auanet.org Contact a health care provider if:  You have difficulty urinating.  You have pain when you urinate or ejaculate.  You have blood in your urine or semen.  You have pain in your back or in the area of your prostate. Summary  Prostate cancer is a common type of cancer in men. The prostate gland is located below the bladder and in front of the rectum. This gland adds fluid to semen during ejaculation.  Prostate cancer screening may identify cancer at an early stage, when the cancer can be treated more easily.  The prostate-specific antigen (PSA) test is the recommended screening test for prostate cancer.  Discuss the risks and benefits of prostate cancer screening with your health care provider. If you are age 42 or older, the risks that screening can cause are greater than the benefits that it may provide. This information is not intended to replace advice given to you by your health care provider. Make sure you discuss any questions you have with your health care provider. Document Revised: 12/28/2019 Document Reviewed: 04/19/2019 Elsevier Patient Education  [place].

## 2020-10-22 ENCOUNTER — Other Ambulatory Visit: Payer: Self-pay

## 2020-10-22 ENCOUNTER — Other Ambulatory Visit: Payer: Medicare HMO

## 2020-10-22 DIAGNOSIS — R972 Elevated prostate specific antigen [PSA]: Secondary | ICD-10-CM

## 2020-10-23 ENCOUNTER — Other Ambulatory Visit: Payer: Self-pay

## 2020-10-23 LAB — PSA: Prostate Specific Ag, Serum: 3.8 ng/mL (ref 0.0–4.0)

## 2020-10-28 ENCOUNTER — Telehealth: Payer: Self-pay

## 2020-10-28 DIAGNOSIS — R972 Elevated prostate specific antigen [PSA]: Secondary | ICD-10-CM

## 2020-10-28 DIAGNOSIS — Z87898 Personal history of other specified conditions: Secondary | ICD-10-CM

## 2020-10-28 NOTE — Telephone Encounter (Signed)
Called pt informed him of the information below. Pt gave verbal understanding. Appts scheduled. Lab ordered.

## 2020-10-28 NOTE — Telephone Encounter (Signed)
-----   Message from Billey Co, MD sent at 10/27/2020 11:19 AM EST ----- Doristine Devoid news, PSA back down to normal, please schedule follow up one year with PSA reflex to free prior, thanks  Nickolas Madrid, MD 10/27/2020

## 2021-02-18 DIAGNOSIS — M1712 Unilateral primary osteoarthritis, left knee: Secondary | ICD-10-CM | POA: Insufficient documentation

## 2021-06-27 NOTE — Discharge Instructions (Signed)
Instructions after Total Knee Replacement   Ash Mcelwain P. Carloyn Lahue, Jr., M.D.     Dept. of Orthopaedics & Sports Medicine  [facility]  [address]  [place], [place]  [address]  Phone: [phone]   Fax: [phone]    DIET: Drink plenty of non-alcoholic fluids. Resume your normal diet. Include foods high in fiber.  ACTIVITY:  You may use crutches or a walker with weight-bearing as tolerated, unless instructed otherwise. You may be weaned off of the walker or crutches by your Physical Therapist.  Do NOT place pillows under the knee. Anything placed under the knee could limit your ability to straighten the knee.   Continue doing gentle exercises. Exercising will reduce the pain and swelling, increase motion, and prevent muscle weakness.   Please continue to use the TED compression stockings for 6 weeks. You may remove the stockings at night, but should reapply them in the morning. Do not drive or operate any equipment until instructed.  WOUND CARE:  Continue to use the PolarCare or ice packs periodically to reduce pain and swelling. You may bathe or shower after the staples are removed at the first office visit following surgery.  MEDICATIONS: You may resume your regular medications. Please take the pain medication as prescribed on the medication. Do not take pain medication on an empty stomach. You have been given a prescription for a blood thinner (Lovenox or Coumadin). Please take the medication as instructed. (NOTE: After completing a 2 week course of Lovenox, take one Enteric-coated aspirin once a day. This along with elevation will help reduce the possibility of phlebitis in your operated leg.) Do not drive or drink alcoholic beverages when taking pain medications.  CALL THE OFFICE FOR: Temperature above 101 degrees Excessive bleeding or drainage on the dressing. Excessive swelling, coldness, or paleness of the toes. Persistent nausea and vomiting.  FOLLOW-UP:  You  should have an appointment to return to the office in 10-14 days after surgery. Arrangements have been made for continuation of Physical Therapy (either home therapy or outpatient therapy).   [facility] Department Directory         [email or web address]       [email or web address]          Cardiology  Appointments: [place] - [phone] [place] - [phone]  Endocrinology  Appointments: [place] - [phone] [place] - [phone]  Gastroenterology  Appointments: [place] - [phone] [place] - [phone]        General Surgery   Appointments: [place] - [phone]  Internal Medicine/Family Medicine  Appointments: [place] - [phone] [place] - [phone] [place] - [phone]  Metabolic and Weigh Loss Surgery  Appointments: [place] - [phone]        Neurology  Appointments: [place] - [phone] [place] - [phone]  Neurosurgery  Appointments: [place] - [phone]  Obstetrics & Gynecology  Appointments: [place] - [phone] [place] - [phone]        Pediatrics  Appointments: [place] - [phone] [place] - [phone]  Physiatry  Appointments: [place] -[phone]  Physical Therapy  Appointments: [place] - [phone] [place] - [phone]        Podiatry  Appointments: [place] - [phone] [place] - [phone]  Pulmonology  Appointments: [place] - [phone]  Rheumatology  Appointments: [place] - [phone]        [place] Location: [facility]  [address] [place], [place]  [address]  [place] Location: [facility] [address] [place], [place]  [address]  [place] Location: [facility] [address] [place], [place]  [address]    

## 2021-07-07 ENCOUNTER — Other Ambulatory Visit
Admission: RE | Admit: 2021-07-07 | Discharge: 2021-07-07 | Disposition: A | Discharge status: Home / Self Care | Payer: Medicare HMO | Source: Ambulatory Visit | Attending: Orthopedic Surgery | Admitting: Orthopedic Surgery

## 2021-07-07 ENCOUNTER — Other Ambulatory Visit: Payer: Self-pay

## 2021-07-07 DIAGNOSIS — Z01818 Encounter for other preprocedural examination: Secondary | ICD-10-CM | POA: Diagnosis present

## 2021-07-07 DIAGNOSIS — R7303 Prediabetes: Secondary | ICD-10-CM

## 2021-07-07 DIAGNOSIS — M1712 Unilateral primary osteoarthritis, left knee: Secondary | ICD-10-CM | POA: Diagnosis not present

## 2021-07-07 HISTORY — DX: Unspecified osteoarthritis, unspecified site: M19.90

## 2021-07-07 LAB — COMPREHENSIVE METABOLIC PANEL
ALT: 22 U/L (ref 0–44)
AST: 18 U/L (ref 15–41)
Albumin: 3.9 g/dL (ref 3.5–5.0)
Alkaline Phosphatase: 49 U/L (ref 38–126)
Anion gap: 7 (ref 5–15)
BUN: 14 mg/dL (ref 8–23)
CO2: 24 mmol/L (ref 22–32)
Calcium: 8.9 mg/dL (ref 8.9–10.3)
Chloride: 108 mmol/L (ref 98–111)
Creatinine, Ser: 1.07 mg/dL (ref 0.61–1.24)
GFR, Estimated: >60 mL/min (ref >60)
Glucose, Bld: 111 mg/dL — ABNORMAL HIGH (ref 70–99)
Potassium: 3.9 mmol/L (ref 3.5–5.1)
Sodium: 139 mmol/L (ref 135–145)
Total Bilirubin: 1.1 mg/dL (ref 0.3–1.2)
Total Protein: 7.6 g/dL (ref 6.5–8.1)

## 2021-07-07 LAB — URINALYSIS, ROUTINE W REFLEX MICROSCOPIC
Bilirubin Urine: NEGATIVE
Glucose, UA: NEGATIVE mg/dL
Hgb urine dipstick: NEGATIVE
Ketones, ur: NEGATIVE mg/dL
Leukocytes,Ua: NEGATIVE
Nitrite: NEGATIVE
Protein, ur: NEGATIVE mg/dL
Specific Gravity, Urine: 1.01 (ref 1.005–1.030)
pH: 6 (ref 5.0–8.0)

## 2021-07-07 LAB — CBC
HCT: 41.5 % (ref 39.0–52.0)
Hemoglobin: 14.2 g/dL (ref 13.0–17.0)
MCH: 29.9 pg (ref 26.0–34.0)
MCHC: 34.2 g/dL (ref 30.0–36.0)
MCV: 87.4 fL (ref 80.0–100.0)
Platelets: 218 10*3/uL (ref 150–400)
RBC: 4.75 MIL/uL (ref 4.22–5.81)
RDW: 14.3 % (ref 11.5–15.5)
WBC: 5.5 10*3/uL (ref 4.0–10.5)
nRBC: 0 % (ref 0.0–0.2)

## 2021-07-07 LAB — SEDIMENTATION RATE: Sed Rate: 7 mm/hr (ref 0–20)

## 2021-07-07 LAB — PROTIME-INR
INR: 1.1 (ref 0.8–1.2)
Prothrombin Time: 13.9 seconds (ref 11.4–15.2)

## 2021-07-07 LAB — APTT: aPTT: 28 seconds (ref 24–36)

## 2021-07-07 LAB — TYPE AND SCREEN
ABO/RH(D): A POS
Antibody Screen: NEGATIVE

## 2021-07-07 LAB — SURGICAL PCR SCREEN
MRSA, PCR: NEGATIVE
Staphylococcus aureus: NEGATIVE

## 2021-07-07 LAB — C-REACTIVE PROTEIN: CRP: 0.5 mg/dL (ref <1.0)

## 2021-07-07 NOTE — Patient Instructions (Signed)
Your procedure is scheduled on: 07/13/2021 Report to the Registration Desk on the 1st floor of the [facility]. To find out your arrival time, please call [phone] between 1PM - 3PM on: 07/10/2021  REMEMBER: Instructions that are not followed completely may result in serious medical risk, up to and including death; or upon the discretion of your surgeon and anesthesiologist your surgery may need to be rescheduled.  Do not eat food after midnight the night before surgery.  No gum chewing, lozengers or hard candies.  You may however, drink CLEAR liquids up to 3 hours before you are scheduled to arrive for your surgery. Do not drink anything within 2 hours of your scheduled arrival time.  Clear liquids include: - water  - apple juice without pulp - gatorade  - black coffee or tea (Do NOT add milk or creamers to the coffee or tea) Do NOT drink anything that is not on this list.  In addition, your doctor has ordered for you to drink the provided  Ensure Pre-Surgery Clear Carbohydrate Drink  Drinking this carbohydrate drink up to two hours before surgery helps to reduce insulin resistance and improve patient outcomes. Please complete drinking 2 hours prior to scheduled arrival time.  One week prior to surgery: Stop Anti-inflammatories (NSAIDS) such as Advil, Aleve, Ibuprofen, Motrin, Naproxen, Naprosyn and Aspirin based products such as Excedrin, Goodys Powder, BC Powder. You may however, continue to take Tylenol if needed for pain up until the day of surgery. Stop ANY OVER THE COUNTER supplements until after surgery.   No Alcohol for 24 hours before or after surgery.  No Smoking including e-cigarettes for 24 hours prior to surgery.  No chewable tobacco products for at least 6 hours prior to surgery.  No nicotine patches on the day of surgery.  Do not use any "recreational" drugs for at least a week prior to your surgery.  Please be advised that the combination of cocaine and  anesthesia may have negative outcomes, up to and including death. If you test positive for cocaine, your surgery will be cancelled.  On the morning of surgery brush your teeth with toothpaste and water, you may rinse your mouth with mouthwash if you wish. Do not swallow any toothpaste or mouthwash.  Use CHG Soap or wipes as directed on instruction sheet.  Do not wear jewelry, make-up, hairpins, clips or nail polish.  Do not wear lotions, powders, or perfumes.   Do not shave body from the neck down 48 hours prior to surgery just in case you cut yourself which could leave a site for infection.  Also, freshly shaved skin may become irritated if using the CHG soap.  Contact lenses, hearing aids and dentures may not be worn into surgery.  Do not bring valuables to the hospital. [facility] is not responsible for any missing/lost belongings or valuables.   Notify your doctor if there is any change in your medical condition (cold, fever, infection).  Wear comfortable clothing (specific to your surgery type) to the hospital.  If you are being admitted to the hospital overnight, leave your suitcase in the car. After surgery it may be brought to your room.  If you are being discharged the day of surgery, you will not be allowed to drive home. You will need a responsible adult (18 years or older) to drive you home and stay with you that night.   If you are taking public transportation, you will need to have a responsible adult (18 years  or older) with you. Please confirm with your physician that it is acceptable to use public transportation.   Please call the [facility]. at [phone] if you have any questions about these instructions.  Surgery Visitation Policy:  Patients undergoing a surgery or procedure may have one family member or support person with them as long as that person is not COVID-19 positive or experiencing its symptoms.  That person may remain in the  waiting area during the procedure and may rotate out with other people.  Inpatient Visitation:    Visiting hours are 7 a.m. to 8 p.m. Up to two visitors ages 16+ are allowed at one time in a patient room. The visitors may rotate out with other people during the day. Visitors must check out when they leave, or other visitors will not be allowed. One designated support person may remain overnight. The visitor must pass COVID-19 screenings, use hand sanitizer when entering and exiting the patient's room and wear a mask at all times, including in the patient's room. Patients must also wear a mask when staff or their visitor are in the room. Masking is required regardless of vaccination status.

## 2021-07-09 ENCOUNTER — Other Ambulatory Visit: Payer: Self-pay

## 2021-07-09 ENCOUNTER — Other Ambulatory Visit
Admission: RE | Admit: 2021-07-09 | Discharge: 2021-07-09 | Disposition: A | Discharge status: Home / Self Care | Payer: Medicare HMO | Source: Ambulatory Visit | Attending: Orthopedic Surgery | Admitting: Orthopedic Surgery

## 2021-07-09 DIAGNOSIS — Z01812 Encounter for preprocedural laboratory examination: Secondary | ICD-10-CM | POA: Insufficient documentation

## 2021-07-09 DIAGNOSIS — Z20822 Contact with and (suspected) exposure to covid-19: Secondary | ICD-10-CM | POA: Diagnosis not present

## 2021-07-09 LAB — SARS CORONAVIRUS 2 (TAT 6-24 HRS): SARS Coronavirus 2: NEGATIVE

## 2021-07-11 NOTE — Anesthesia Preprocedure Evaluation (Addendum)
Anesthesia Evaluation  Patient identified by MRN, date of birth, ID band Patient awake    Reviewed: Allergy & Precautions, NPO status , Patient's Chart, lab work & pertinent test results  Airway Mallampati: III  TM Distance: >3 FB Neck ROM: full    Dental  (+) Partial Upper, Missing   Pulmonary asthma (as a child) ,    Pulmonary exam normal        Cardiovascular Exercise Tolerance: Good negative cardio ROS Normal cardiovascular exam     Neuro/Psych negative neurological ROS  negative psych ROS   GI/Hepatic negative GI ROS, Neg liver ROS,   Endo/Other  negative endocrine ROS  Renal/GU negative Renal ROS  negative genitourinary   Musculoskeletal  (+) Arthritis ,   Abdominal Normal abdominal exam  (+)   Peds  Hematology negative hematology ROS (+)   Anesthesia Other Findings Past Medical History: No date: Allergy     Comment:  Seasonal No date: Asthma     Comment:  as a child No date: Elevated PSA No date: History of kidney stones No date: Hyperlipidemia No date: Kidney stone No date: Osteoarthritis     Comment:  Left Knee  Past Surgical History: 2001: EXTRACORPOREAL SHOCK WAVE LITHOTRIPSY 05/04/2018: EXTRACORPOREAL SHOCK WAVE LITHOTRIPSY; Right     Comment:  Procedure: EXTRACORPOREAL SHOCK WAVE LITHOTRIPSY (ESWL);              Surgeon: Abbie Sons, MD;  Location: [facility] ORS;                Service: Urology;  Laterality: Right; 1994: HERNIA REPAIR     Comment:  Right Inguinal- [facility]     Reproductive/Obstetrics negative OB ROS                            Anesthesia Physical Anesthesia Plan  ASA: 2  Anesthesia Plan: Spinal   Post-op Pain Management:    Induction:   PONV Risk Score and Plan: 2 and TIVA  Airway Management Planned: Natural Airway and Simple Face Mask  Additional Equipment:   Intra-op Plan:   Post-operative Plan:   Informed  Consent: I have reviewed the patients History and Physical, chart, labs and discussed the procedure including the risks, benefits and alternatives for the proposed anesthesia with the patient or authorized representative who has indicated his/her understanding and acceptance.     Dental advisory given  Plan Discussed with: CRNA, Anesthesiologist and Surgeon  Anesthesia Plan Comments:        Anesthesia Quick Evaluation

## 2021-07-12 NOTE — H&P (Signed)
ORTHOPAEDIC HISTORY & PHYSICAL Gwenlyn Fudge, [place] - 07/07/2021 8:45 AM EDT Formatting of this note is different from the original. [place] Chief Complaint:   Chief Complaint  Patient presents with   Knee Pain  H & P LEFT KNEE   History of Present Illness:   Shane Abbott is a 69 y.o. male that presents to clinic today for his preoperative history and evaluation. Patient presents unaccompanied. The patient is scheduled to undergo a left total knee arthroplasty on 07/13/2021 by Dr. Marry Guan. His pain began several years ago. The pain is located primarily along the medial aspect of the knee. He describes his pain as worse with any weightbearing. He reports associated swelling with some giving way of the knee. He denies associated numbness or tingling, denies locking.   The patient's symptoms have progressed to the point that they decrease his quality of life. The patient has previously undergone conservative treatment including NSAIDS and injections to the knee without adequate control of his symptoms.  Denies significant cardiac history, history of blood clots, or history of lumbar surgery.   Patient lives with his wife at home. She is a retired Therapist, sports.  Past Medical, Surgical, Family, Social History, Allergies, Medications:   Past Medical History:  Past Medical History:  Diagnosis Date   Asthma without status asthmaticus, unspecified   Cervicalgia   Hyperlipidemia April 2018   Kidney stones   Past Surgical History:  Past Surgical History:  Procedure Laterality Date   HERNIA REPAIR   kidney stone removal 2001   Current Medications:  Current Outpatient Medications  Medication Sig Dispense Refill   acetaminophen (TYLENOL) 500 mg capsule Take 1,000 mg by mouth every 8 (eight) hours as needed   ascorbic acid, vitamin C, (VITAMIN C) 500 MG tablet Take 500 mg by mouth once daily   folic acid/multivit-min/lutein (CENTRUM  SILVER ORAL) Take 1 caplet by mouth once daily   ibuprofen (MOTRIN) 400 MG tablet Take 400 mg by mouth every 6 (six) hours as needed   No current facility-administered medications for this visit.   Allergies: No Known Allergies  Social History:  Social History   Socioeconomic History   Marital status: Married  Spouse name: Claudette   Number of children: 1   Years of education: 80  Occupational History   Occupation: Rertired-General Games developer  Tobacco Use   Smoking status: Never Smoker   Smokeless tobacco: Never Used  Scientific laboratory technician Use: Never used  Substance and Sexual Activity   Alcohol use: Yes  Alcohol/week: 2.0 standard drinks  Types: 2 Cans of beer per week  Comment: occasional   Drug use: No   Sexual activity: Yes  Partners: Female   Family History:  Family History  Problem Relation Age of Onset   Heart disease Mother   No Known Problems Father   No Known Problems Sister   No Known Problems Brother   No Known Problems Maternal Aunt   No Known Problems Maternal Uncle   No Known Problems Paternal Aunt   No Known Problems Paternal Uncle   No Known Problems Maternal Grandmother   No Known Problems Maternal Grandfather   No Known Problems Paternal Grandmother   No Known Problems Paternal Grandfather   Allergic rhinitis Neg Hx   Amblyopia Neg Hx   Ankylosing spondylitis Neg Hx   Atrial fibrillation (Abnormal heart rhythm sometimes requiring treatment with blood thinners) Neg Hx   Basal cell carcinoma Neg  Hx   Blindness Neg Hx   Cataracts Neg Hx   Coronary Artery Disease (Blocked arteries around heart) Neg Hx   Diabetes Neg Hx   Diabetes type I Neg Hx   Diabetes type II Neg Hx   Glaucoma Neg Hx   Graves' disease Neg Hx   Hashimoto's thyroiditis Neg Hx   High blood pressure (Hypertension) Neg Hx   Hyperthyroidism Neg Hx   Hypothyroidism Neg Hx   Macular degeneration Neg Hx   Melanoma Neg Hx   Neuropathy Neg Hx   Renal Insufficiency Neg Hx    Retinal degeneration Neg Hx   Retinopathy of prematurity Neg Hx   Rheum arthritis Neg Hx   Sarcoidosis Neg Hx   Sinusitis Neg Hx   Sleep apnea Neg Hx   Strabismus Neg Hx   Thyroid disease Neg Hx   Vision loss Neg Hx   Review of Systems:   A 10+ ROS was performed, reviewed, and the pertinent orthopaedic findings are documented in the HPI.   Physical Examination:   BP 130/86 (BP Location: Left upper arm, Patient Position: Sitting, BP Cuff Size: Adult)  Ht 180.3 cm (5\' 11" )  Wt 88.2 kg (194 lb 6.4 oz)  BMI 27.11 kg/m   Patient is a well-developed, well-nourished male in no acute distress. Patient has normal mood and affect. Patient is alert and oriented to person, place, and time.   HEENT: Atraumatic, normocephalic. Pupils equal and reactive to light. Extraocular motion intact. Noninjected sclera.  Cardiovascular: Regular rate and rhythm, with no murmurs, rubs, or gallops. Distal pulses palpable.  Respiratory: Lungs clear to auscultation bilaterally.   Left Knee: Soft tissue swelling: mild Effusion: none Erythema: none Crepitance: mild Tenderness: medial Alignment: relative varus Mediolateral laxity: medial pseudolaxity Posterior sag: negative Patellar tracking: Good tracking without evidence of subluxation or tilt Atrophy: No significant atrophy.  Quadriceps tone was good. Range of motion: 0/10/125 degrees  Sensation intact over the saphenous, lateral sural cutaneous, superficial fibular, and deep fibular nerve distributions.  Tests Performed/Reviewed:  X-rays  3 views of the left knee were obtained. Images reveal moderate loss of medial compartment joint space with osteophyte formation. No fractures or dislocations. No other osseous abnormality noted.  I personally ordered and interpreted today's x-rays.  Impression:   ICD-10-CM  1. Primary osteoarthritis of left knee M17.12   Plan:   The patient has end-stage degenerative changes of the left knee. It was  explained to the patient that the condition is progressive in nature. Having failed conservative treatment, the patient has elected to proceed with a total joint arthroplasty. The patient will undergo a total joint arthroplasty with Dr. Marry Guan. The risks of surgery, including blood clot and infection, were discussed with the patient. Measures to reduce these risks, including the use of anticoagulation, perioperative antibiotics, and early ambulation were discussed. The importance of postoperative physical therapy was discussed with the patient. The patient elects to proceed with surgery. The patient is instructed to stop all blood thinners prior to surgery. The patient is instructed to call the hospital the day before surgery to learn of the proper arrival time.   Contact our office with any questions or concerns. Follow up as indicated, or sooner should any new problems arise, if conditions worsen, or if they are otherwise concerned.   Gwenlyn Fudge, [facility] [place], [place] [address] Phone: [phone]  This note was generated in part with voice recognition software and I  apologize for any typographical errors that were not detected and corrected.  Electronically signed by Gwenlyn Fudge, PA at 07/07/2021 10:08 AM EDT

## 2021-07-13 ENCOUNTER — Other Ambulatory Visit: Payer: Self-pay

## 2021-07-13 ENCOUNTER — Observation Stay: Payer: Medicare HMO

## 2021-07-13 ENCOUNTER — Encounter
Admission: RE | Disposition: A | Discharge status: Home / Self Care | Payer: Self-pay | Source: Home / Self Care | Attending: Orthopedic Surgery

## 2021-07-13 ENCOUNTER — Observation Stay
Admission: RE | Admit: 2021-07-13 | Discharge: 2021-07-14 | Disposition: A | Discharge status: Home / Self Care | Payer: Medicare HMO | Attending: Orthopedic Surgery | Admitting: Orthopedic Surgery

## 2021-07-13 ENCOUNTER — Ambulatory Visit: Payer: Medicare HMO | Admitting: Anesthesiology

## 2021-07-13 ENCOUNTER — Ambulatory Visit: Payer: Medicare HMO | Admitting: Urgent Care

## 2021-07-13 ENCOUNTER — Encounter: Payer: Self-pay | Admitting: Orthopedic Surgery

## 2021-07-13 DIAGNOSIS — R7303 Prediabetes: Secondary | ICD-10-CM | POA: Insufficient documentation

## 2021-07-13 DIAGNOSIS — Z96659 Presence of unspecified artificial knee joint: Secondary | ICD-10-CM

## 2021-07-13 DIAGNOSIS — Z79899 Other long term (current) drug therapy: Secondary | ICD-10-CM | POA: Diagnosis not present

## 2021-07-13 DIAGNOSIS — J45909 Unspecified asthma, uncomplicated: Secondary | ICD-10-CM | POA: Insufficient documentation

## 2021-07-13 DIAGNOSIS — M25562 Pain in left knee: Secondary | ICD-10-CM | POA: Diagnosis present

## 2021-07-13 DIAGNOSIS — M1712 Unilateral primary osteoarthritis, left knee: Secondary | ICD-10-CM | POA: Diagnosis not present

## 2021-07-13 HISTORY — PX: KNEE ARTHROPLASTY: SHX992

## 2021-07-13 LAB — ABO/RH: ABO/RH(D): A POS

## 2021-07-13 SURGERY — ARTHROPLASTY, KNEE, TOTAL, USING IMAGELESS COMPUTER-ASSISTED NAVIGATION
Anesthesia: Spinal | Site: Knee | Laterality: Left

## 2021-07-13 MED ORDER — ENSURE PRE-SURGERY PO LIQD
296.0000 mL | Freq: Once | ORAL | Status: AC
  Administered 2021-07-13: 296 mL via ORAL
  Filled 2021-07-13: qty 296

## 2021-07-13 MED ORDER — NEOMYCIN-POLYMYXIN B GU 40-200000 IR SOLN
Status: DC | PRN
  Administered 2021-07-13: 14 mL

## 2021-07-13 MED ORDER — METOCLOPRAMIDE HCL 10 MG PO TABS
10.0000 mg | ORAL_TABLET | Freq: Three times a day (TID) | ORAL | Status: DC
  Administered 2021-07-13 – 2021-07-14 (×3): 10 mg via ORAL
  Filled 2021-07-13 (×3): qty 1

## 2021-07-13 MED ORDER — GABAPENTIN 300 MG PO CAPS
ORAL_CAPSULE | ORAL | Status: AC
  Administered 2021-07-13: 300 mg via ORAL
  Filled 2021-07-13: qty 1

## 2021-07-13 MED ORDER — OXYCODONE HCL 5 MG PO TABS: 5.0000 mg | ORAL_TABLET | Freq: Once | ORAL | Status: DC | PRN

## 2021-07-13 MED ORDER — TRANEXAMIC ACID-NACL 1000-0.7 MG/100ML-% IV SOLN
INTRAVENOUS | Status: AC
  Filled 2021-07-13: qty 100

## 2021-07-13 MED ORDER — PHENOL 1.4 % MT LIQD
1.0000 | OROMUCOSAL | Status: DC | PRN
  Filled 2021-07-13: qty 177

## 2021-07-13 MED ORDER — CEFAZOLIN SODIUM-DEXTROSE 2-4 GM/100ML-% IV SOLN
2.0000 g | INTRAVENOUS | Status: AC
  Administered 2021-07-13: 2 g via INTRAVENOUS

## 2021-07-13 MED ORDER — SENNOSIDES-DOCUSATE SODIUM 8.6-50 MG PO TABS
1.0000 | ORAL_TABLET | Freq: Two times a day (BID) | ORAL | Status: DC
  Administered 2021-07-13 – 2021-07-14 (×2): 1 via ORAL
  Filled 2021-07-13 (×2): qty 1

## 2021-07-13 MED ORDER — DEXAMETHASONE SODIUM PHOSPHATE 10 MG/ML IJ SOLN
INTRAMUSCULAR | Status: AC
  Administered 2021-07-13: 8 mg via INTRAVENOUS
  Filled 2021-07-13: qty 1

## 2021-07-13 MED ORDER — SURGIPHOR WOUND IRRIGATION SYSTEM - OPTIME
TOPICAL | Status: DC | PRN
  Administered 2021-07-13: 1 via TOPICAL

## 2021-07-13 MED ORDER — FAMOTIDINE 20 MG PO TABS: 20.0000 mg | ORAL_TABLET | Freq: Once | ORAL | Status: AC

## 2021-07-13 MED ORDER — ACETAMINOPHEN 10 MG/ML IV SOLN
INTRAVENOUS | Status: AC
  Filled 2021-07-13: qty 100

## 2021-07-13 MED ORDER — PROMETHAZINE HCL 25 MG/ML IJ SOLN: 6.2500 mg | INTRAMUSCULAR | Status: DC | PRN

## 2021-07-13 MED ORDER — ENOXAPARIN SODIUM 30 MG/0.3ML IJ SOSY
30.0000 mg | PREFILLED_SYRINGE | Freq: Two times a day (BID) | INTRAMUSCULAR | Status: DC
  Administered 2021-07-14: 30 mg via SUBCUTANEOUS
  Filled 2021-07-13: qty 0.3

## 2021-07-13 MED ORDER — MAGNESIUM HYDROXIDE 400 MG/5ML PO SUSP
30.0000 mL | Freq: Every day | ORAL | Status: DC
  Administered 2021-07-14: 30 mL via ORAL
  Filled 2021-07-13: qty 30

## 2021-07-13 MED ORDER — MENTHOL 3 MG MT LOZG
1.0000 | LOZENGE | OROMUCOSAL | Status: DC | PRN
  Filled 2021-07-13: qty 9

## 2021-07-13 MED ORDER — BUPIVACAINE HCL (PF) 0.25 % IJ SOLN
INTRAMUSCULAR | Status: DC | PRN
  Administered 2021-07-13: 60 mL

## 2021-07-13 MED ORDER — BUPIVACAINE HCL (PF) 0.5 % IJ SOLN
INTRAMUSCULAR | Status: DC | PRN
  Administered 2021-07-13: 3 mL

## 2021-07-13 MED ORDER — PROPOFOL 10 MG/ML IV BOLUS
INTRAVENOUS | Status: AC
  Filled 2021-07-13: qty 20

## 2021-07-13 MED ORDER — CHLORHEXIDINE GLUCONATE 0.12 % MT SOLN: 15.0000 mL | Freq: Once | OROMUCOSAL | Status: AC

## 2021-07-13 MED ORDER — 0.9 % SODIUM CHLORIDE (POUR BTL) OPTIME
TOPICAL | Status: DC | PRN
  Administered 2021-07-13: 500 mL

## 2021-07-13 MED ORDER — FLEET ENEMA 7-19 GM/118ML RE ENEM: 1.0000 | ENEMA | Freq: Once | RECTAL | Status: DC | PRN

## 2021-07-13 MED ORDER — TRAMADOL HCL 50 MG PO TABS
50.0000 mg | ORAL_TABLET | ORAL | Status: DC | PRN
  Administered 2021-07-13 – 2021-07-14 (×4): 100 mg via ORAL
  Filled 2021-07-13 (×4): qty 2

## 2021-07-13 MED ORDER — PROPOFOL 1000 MG/100ML IV EMUL
INTRAVENOUS | Status: AC
  Filled 2021-07-13: qty 100

## 2021-07-13 MED ORDER — MIDAZOLAM HCL 2 MG/2ML IJ SOLN
INTRAMUSCULAR | Status: AC
  Filled 2021-07-13: qty 2

## 2021-07-13 MED ORDER — TRANEXAMIC ACID-NACL 1000-0.7 MG/100ML-% IV SOLN
1000.0000 mg | Freq: Once | INTRAVENOUS | Status: AC
  Administered 2021-07-13: 1000 mg via INTRAVENOUS

## 2021-07-13 MED ORDER — PANTOPRAZOLE SODIUM 40 MG PO TBEC
40.0000 mg | DELAYED_RELEASE_TABLET | Freq: Two times a day (BID) | ORAL | Status: DC
  Administered 2021-07-13 – 2021-07-14 (×2): 40 mg via ORAL
  Filled 2021-07-13 (×2): qty 1

## 2021-07-13 MED ORDER — ONDANSETRON HCL 4 MG/2ML IJ SOLN: 4.0000 mg | Freq: Four times a day (QID) | INTRAMUSCULAR | Status: DC | PRN

## 2021-07-13 MED ORDER — FENTANYL CITRATE (PF) 100 MCG/2ML IJ SOLN: 25.0000 ug | INTRAMUSCULAR | Status: DC | PRN

## 2021-07-13 MED ORDER — CELECOXIB 200 MG PO CAPS
ORAL_CAPSULE | ORAL | Status: AC
  Administered 2021-07-13: 400 mg via ORAL
  Filled 2021-07-13: qty 2

## 2021-07-13 MED ORDER — SODIUM CHLORIDE 0.9 % IR SOLN
Status: DC | PRN
  Administered 2021-07-13: 3000 mL

## 2021-07-13 MED ORDER — DEXAMETHASONE SODIUM PHOSPHATE 10 MG/ML IJ SOLN: 8.0000 mg | Freq: Once | INTRAMUSCULAR | Status: AC

## 2021-07-13 MED ORDER — FENTANYL CITRATE (PF) 100 MCG/2ML IJ SOLN
INTRAMUSCULAR | Status: AC
  Filled 2021-07-13: qty 2

## 2021-07-13 MED ORDER — MIDAZOLAM HCL 5 MG/5ML IJ SOLN
INTRAMUSCULAR | Status: DC | PRN
  Administered 2021-07-13 (×2): 1 mg via INTRAVENOUS

## 2021-07-13 MED ORDER — SODIUM CHLORIDE 0.9 % IV SOLN
INTRAVENOUS | Status: DC | PRN
  Administered 2021-07-13: 60 mL

## 2021-07-13 MED ORDER — FAMOTIDINE 20 MG PO TABS
ORAL_TABLET | ORAL | Status: AC
  Administered 2021-07-13: 20 mg via ORAL
  Filled 2021-07-13: qty 1

## 2021-07-13 MED ORDER — OXYCODONE HCL 5 MG/5ML PO SOLN: 5.0000 mg | Freq: Once | ORAL | Status: DC | PRN

## 2021-07-13 MED ORDER — LACTATED RINGERS IV SOLN: INTRAVENOUS | Status: DC

## 2021-07-13 MED ORDER — CEFAZOLIN SODIUM-DEXTROSE 2-4 GM/100ML-% IV SOLN
INTRAVENOUS | Status: AC
  Filled 2021-07-13: qty 100

## 2021-07-13 MED ORDER — CHLORHEXIDINE GLUCONATE 0.12 % MT SOLN
OROMUCOSAL | Status: AC
  Administered 2021-07-13: 15 mL via OROMUCOSAL
  Filled 2021-07-13: qty 15

## 2021-07-13 MED ORDER — ACETAMINOPHEN 10 MG/ML IV SOLN
1000.0000 mg | Freq: Four times a day (QID) | INTRAVENOUS | Status: DC
  Administered 2021-07-14 (×3): 1000 mg via INTRAVENOUS
  Filled 2021-07-13 (×3): qty 100

## 2021-07-13 MED ORDER — TRANEXAMIC ACID-NACL 1000-0.7 MG/100ML-% IV SOLN
1000.0000 mg | INTRAVENOUS | Status: AC
  Administered 2021-07-13: 1000 mg via INTRAVENOUS

## 2021-07-13 MED ORDER — GABAPENTIN 300 MG PO CAPS: 300.0000 mg | ORAL_CAPSULE | Freq: Once | ORAL | Status: AC

## 2021-07-13 MED ORDER — ACETAMINOPHEN 10 MG/ML IV SOLN: 1000.0000 mg | Freq: Once | INTRAVENOUS | Status: DC | PRN

## 2021-07-13 MED ORDER — ALUM & MAG HYDROXIDE-SIMETH 200-200-20 MG/5ML PO SUSP: 30.0000 mL | ORAL | Status: DC | PRN

## 2021-07-13 MED ORDER — ACETAMINOPHEN 10 MG/ML IV SOLN
INTRAVENOUS | Status: DC | PRN
  Administered 2021-07-13: 1000 mg via INTRAVENOUS

## 2021-07-13 MED ORDER — SODIUM CHLORIDE 0.9 % IV SOLN: INTRAVENOUS | Status: DC

## 2021-07-13 MED ORDER — CEFAZOLIN SODIUM-DEXTROSE 2-4 GM/100ML-% IV SOLN
2.0000 g | Freq: Four times a day (QID) | INTRAVENOUS | Status: AC
  Administered 2021-07-13 – 2021-07-14 (×2): 2 g via INTRAVENOUS
  Filled 2021-07-13 (×2): qty 100

## 2021-07-13 MED ORDER — ORAL CARE MOUTH RINSE: 15.0000 mL | Freq: Once | OROMUCOSAL | Status: AC

## 2021-07-13 MED ORDER — PROPOFOL 500 MG/50ML IV EMUL
INTRAVENOUS | Status: DC | PRN
  Administered 2021-07-13: 100 ug/kg/min via INTRAVENOUS

## 2021-07-13 MED ORDER — DROPERIDOL 2.5 MG/ML IJ SOLN
0.6250 mg | Freq: Once | INTRAMUSCULAR | Status: DC | PRN
  Filled 2021-07-13: qty 2

## 2021-07-13 MED ORDER — ADULT MULTIVITAMIN W/MINERALS CH
1.0000 | ORAL_TABLET | Freq: Every day | ORAL | Status: DC
  Administered 2021-07-14: 1 via ORAL
  Filled 2021-07-13: qty 1

## 2021-07-13 MED ORDER — BISACODYL 10 MG RE SUPP: 10.0000 mg | Freq: Every day | RECTAL | Status: DC | PRN

## 2021-07-13 MED ORDER — ONDANSETRON HCL 4 MG PO TABS: 4.0000 mg | ORAL_TABLET | Freq: Four times a day (QID) | ORAL | Status: DC | PRN

## 2021-07-13 MED ORDER — FERROUS SULFATE 325 (65 FE) MG PO TABS
325.0000 mg | ORAL_TABLET | Freq: Two times a day (BID) | ORAL | Status: DC
  Administered 2021-07-14: 325 mg via ORAL
  Filled 2021-07-13: qty 1

## 2021-07-13 MED ORDER — CELECOXIB 200 MG PO CAPS: 400.0000 mg | ORAL_CAPSULE | Freq: Once | ORAL | Status: AC

## 2021-07-13 MED ORDER — DIPHENHYDRAMINE HCL 12.5 MG/5ML PO ELIX: 12.5000 mg | ORAL_SOLUTION | ORAL | Status: DC | PRN

## 2021-07-13 MED ORDER — FENTANYL CITRATE (PF) 100 MCG/2ML IJ SOLN
INTRAMUSCULAR | Status: DC | PRN
  Administered 2021-07-13: 50 ug via INTRAVENOUS

## 2021-07-13 SURGICAL SUPPLY — 78 items
ATTUNE MED DOME PAT 38 KNEE (Knees) ×1 IMPLANT
ATTUNE PS FEM LT SZ 6 CEM KNEE (Femur) ×1 IMPLANT
ATTUNE PSRP INSR SZ6 5 KNEE (Insert) ×1 IMPLANT
BASE TIBIAL ROT PLAT SZ 8 KNEE (Knees) IMPLANT
BATTERY INSTRU NAVIGATION (MISCELLANEOUS) ×8 IMPLANT
BLADE SAW 70X12.5 (BLADE) ×2 IMPLANT
BLADE SAW 90X13X1.19 OSCILLAT (BLADE) ×2 IMPLANT
BLADE SAW 90X25X1.19 OSCILLAT (BLADE) ×2 IMPLANT
CEMENT HV SMART SET (Cement) ×2 IMPLANT
COOLER POLAR GLACIER W/PUMP (MISCELLANEOUS) ×2 IMPLANT
CUFF TOURN SGL QUICK 24 (TOURNIQUET CUFF) ×1
CUFF TOURN SGL QUICK 34 (TOURNIQUET CUFF)
CUFF TRNQT CYL 24X4X16.5-23 (TOURNIQUET CUFF) IMPLANT
CUFF TRNQT CYL 34X4.125X (TOURNIQUET CUFF) IMPLANT
DRAPE 3/4 80X56 (DRAPES) ×2 IMPLANT
DRAPE INCISE IOBAN 66X45 STRL (DRAPES) ×2 IMPLANT
DRAPE U-SHAPE 47X51 STRL (DRAPES) ×1 IMPLANT
DRSG DERMACEA 8X12 NADH (GAUZE/BANDAGES/DRESSINGS) ×2 IMPLANT
DRSG MEPILEX SACRM 8.7X9.8 (GAUZE/BANDAGES/DRESSINGS) ×2 IMPLANT
DRSG OPSITE POSTOP 4X12 (GAUZE/BANDAGES/DRESSINGS) ×1 IMPLANT
DRSG OPSITE POSTOP 4X14 (GAUZE/BANDAGES/DRESSINGS) ×2 IMPLANT
DRSG TEGADERM 4X4.75 (GAUZE/BANDAGES/DRESSINGS) ×2 IMPLANT
DURAPREP 26ML APPLICATOR (WOUND CARE) ×4 IMPLANT
ELECT BLADE 6.5 EXT (BLADE) ×1 IMPLANT
ELECT CAUTERY BLADE 6.4 (BLADE) ×2 IMPLANT
ELECT REM PT RETURN 9FT ADLT (ELECTROSURGICAL) ×2
ELECTRODE REM PT RTRN 9FT ADLT (ELECTROSURGICAL) ×1 IMPLANT
EX-PIN ORTHOLOCK NAV 4X150 (PIN) ×4 IMPLANT
GLOVE SRG 8 PF TXTR STRL LF DI (GLOVE) ×1 IMPLANT
GLOVE SURG ENC TEXT LTX SZ7.5 (GLOVE) ×4 IMPLANT
GLOVE SURG UNDER POLY LF SZ7.5 (GLOVE) ×8 IMPLANT
GLOVE SURG UNDER POLY LF SZ8 (GLOVE) ×1
GOWN STRL REUS W/ TWL LRG LVL3 (GOWN DISPOSABLE) ×2 IMPLANT
GOWN STRL REUS W/ TWL XL LVL3 (GOWN DISPOSABLE) ×1 IMPLANT
GOWN STRL REUS W/TWL LRG LVL3 (GOWN DISPOSABLE) ×2
GOWN STRL REUS W/TWL XL LVL3 (GOWN DISPOSABLE) ×1
HEMOVAC 400CC 10FR (MISCELLANEOUS) ×2 IMPLANT
HOLDER FOLEY CATH W/STRAP (MISCELLANEOUS) ×2 IMPLANT
IRRIGATION SURGIPHOR STRL (IV SOLUTION) ×2 IMPLANT
IV NS IRRIG 3000ML ARTHROMATIC (IV SOLUTION) ×2 IMPLANT
KIT TURNOVER KIT A (KITS) ×2 IMPLANT
KNIFE SCULPS 14X20 (INSTRUMENTS) ×2 IMPLANT
LABEL OR SOLS (LABEL) ×2 IMPLANT
MANIFOLD NEPTUNE II (INSTRUMENTS) ×4 IMPLANT
NDL SAFETY ECLIPSE 18X1.5 (NEEDLE) ×1 IMPLANT
NDL SPNL 20GX3.5 QUINCKE YW (NEEDLE) ×2 IMPLANT
NEEDLE HYPO 18GX1.5 SHARP (NEEDLE) ×1
NEEDLE SPNL 20GX3.5 QUINCKE YW (NEEDLE) ×4 IMPLANT
NS IRRIG 500ML POUR BTL (IV SOLUTION) ×2 IMPLANT
PACK TOTAL KNEE (MISCELLANEOUS) ×2 IMPLANT
PAD ABD DERMACEA PRESS 5X9 (GAUZE/BANDAGES/DRESSINGS) ×4 IMPLANT
PAD WRAPON POLAR KNEE (MISCELLANEOUS) ×1 IMPLANT
PENCIL SMOKE EVACUATOR COATED (MISCELLANEOUS) ×2 IMPLANT
PIN DRILL FIX HALF THREAD (BIT) ×4 IMPLANT
PIN DRILL QUICK PACK ×3 IMPLANT
PIN FIXATION 1/8DIA X 3INL (PIN) ×2 IMPLANT
PULSAVAC PLUS IRRIG FAN TIP (DISPOSABLE) ×2
SOL PREP PVP 2OZ (MISCELLANEOUS) ×2
SOLUTION PREP PVP 2OZ (MISCELLANEOUS) ×1 IMPLANT
SPONGE DRAIN TRACH 4X4 STRL 2S (GAUZE/BANDAGES/DRESSINGS) ×2 IMPLANT
SPONGE T-LAP 18X18 ~~LOC~~+RFID (SPONGE) ×6 IMPLANT
STAPLER SKIN PROX 35W (STAPLE) ×2 IMPLANT
STOCKINETTE IMPERV 14X48 (MISCELLANEOUS) ×1 IMPLANT
STRAP TIBIA SHORT (MISCELLANEOUS) ×2 IMPLANT
SUCTION FRAZIER HANDLE 10FR (MISCELLANEOUS) ×1
SUCTION TUBE FRAZIER 10FR DISP (MISCELLANEOUS) ×1 IMPLANT
SUT VIC AB 0 CT1 36 (SUTURE) ×4 IMPLANT
SUT VIC AB 1 CT1 36 (SUTURE) ×4 IMPLANT
SUT VIC AB 2-0 CT2 27 (SUTURE) ×2 IMPLANT
SYR 20ML LL LF (SYRINGE) ×2 IMPLANT
SYR 30ML LL (SYRINGE) ×4 IMPLANT
TIBIAL BASE ROT PLAT SZ 8 KNEE (Knees) ×2 IMPLANT
TIP FAN IRRIG PULSAVAC PLUS (DISPOSABLE) ×1 IMPLANT
TOWEL OR 17X26 4PK STRL BLUE (TOWEL DISPOSABLE) ×2 IMPLANT
TOWER CARTRIDGE SMART MIX (DISPOSABLE) ×2 IMPLANT
TRAY FOLEY MTR SLVR 16FR STAT (SET/KITS/TRAYS/PACK) ×2 IMPLANT
WATER STERILE IRR 500ML POUR (IV SOLUTION) ×2 IMPLANT
WRAPON POLAR PAD KNEE (MISCELLANEOUS) ×2

## 2021-07-13 NOTE — Op Note (Signed)
OPERATIVE NOTE  DATE OF SURGERY:  07/13/2021  PATIENT NAME:  Shane Abbott   DOB: 07-24-1951  MRN: [id number]  PRE-OPERATIVE DIAGNOSIS: Degenerative arthrosis of the left knee, primary  POST-OPERATIVE DIAGNOSIS:  Same  PROCEDURE:  Left total knee arthroplasty using computer-assisted navigation  SURGEON:  Marciano Sequin. M.D.  ASSISTANT: Cassell Smiles, PA-C (present and scrubbed throughout the case, critical for assistance with exposure, retraction, instrumentation, and closure)  ANESTHESIA: spinal  ESTIMATED BLOOD LOSS: 100 mL  FLUIDS REPLACED: 1800 mL of crystalloid  TOURNIQUET TIME: 101 minutes  DRAINS: 2 medium Hemovac drains  SOFT TISSUE RELEASES: Anterior cruciate ligament, posterior cruciate ligament, deep  medial collateral ligament, patellofemoral ligament  IMPLANTS UTILIZED: DePuy Attune size 6 posterior stabilized femoral component (cemented), size 8 rotating platform tibial component (cemented), 38 mm medialized dome patella (cemented), and a 5 mm stabilized rotating platform polyethylene insert.  INDICATIONS FOR SURGERY: Shane Abbott is a 70 y.o. year old male with a long history of progressive knee pain. X-rays demonstrated severe degenerative changes in tricompartmental fashion. The patient had not seen any significant improvement despite conservative nonsurgical intervention. After discussion of the risks and benefits of surgical intervention, the patient expressed understanding of the risks benefits and agree with plans for total knee arthroplasty.   The risks, benefits, and alternatives were discussed at length including but not limited to the risks of infection, bleeding, nerve injury, stiffness, blood clots, the need for revision surgery, cardiopulmonary complications, among others, and they were willing to proceed.  PROCEDURE IN DETAIL: The patient was brought into the operating room and, after adequate spinal anesthesia was achieved, a tourniquet  was placed on the patient's upper thigh. The patient's knee and leg were cleaned and prepped with alcohol and DuraPrep and draped in the usual sterile fashion. A "timeout" was performed as per usual protocol. The lower extremity was exsanguinated using an Esmarch, and the tourniquet was inflated to 300 mmHg. An anterior longitudinal incision was made followed by a standard mid vastus approach. The deep fibers of the medial collateral ligament were elevated in a subperiosteal fashion off of the medial flare of the tibia so as to maintain a continuous soft tissue sleeve. The patella was subluxed laterally and the patellofemoral ligament was incised. Inspection of the knee demonstrated severe degenerative changes with full-thickness loss of articular cartilage. Osteophytes were debrided using a rongeur. Anterior and posterior cruciate ligaments were excised. Two 4.0 mm Schanz pins were inserted in the femur and into the tibia for attachment of the array of trackers used for computer-assisted navigation. Hip center was identified using a circumduction technique. Distal landmarks were mapped using the computer. The distal femur and proximal tibia were mapped using the computer. The distal femoral cutting guide was positioned using computer-assisted navigation so as to achieve a 5 distal valgus cut. The femur was sized and it was felt that a size 6 femoral component was appropriate. A size 6 femoral cutting guide was positioned and the anterior cut was performed and verified using the computer. This was followed by completion of the posterior and chamfer cuts. Femoral cutting guide for the central box was then positioned in the center box cut was performed.  Attention was then directed to the proximal tibia. Medial and lateral menisci were excised. The extramedullary tibial cutting guide was positioned using computer-assisted navigation so as to achieve a 0 varus-valgus alignment and 3 posterior slope. The cut was  performed and verified using the computer. The proximal tibia  was sized and it was felt that a size 8 tibial tray was appropriate. Tibial and femoral trials were inserted followed by insertion of a 5 mm polyethylene insert. This allowed for excellent mediolateral soft tissue balancing both in flexion and in full extension. Finally, the patella was cut and prepared so as to accommodate a 38 mm medialized dome patella. A patella trial was placed and the knee was placed through a range of motion with excellent patellar tracking appreciated. The femoral trial was removed after debridement of posterior osteophytes. The central post-hole for the tibial component was reamed followed by insertion of a keel punch. Tibial trials were then removed. Cut surfaces of bone were irrigated with copious amounts of normal saline using pulsatile lavage and then suctioned dry. Polymethylmethacrylate cement was prepared in the usual fashion using a vacuum mixer. Cement was applied to the cut surface of the proximal tibia as well as along the undersurface of a size 8 rotating platform tibial component. Tibial component was positioned and impacted into place. Excess cement was removed using [employer] fast streamer. Cement was then applied to the cut surfaces of the femur as well as along the posterior flanges of the size 6 femoral component. The femoral component was positioned and impacted into place. Excess cement was removed using [employer] fast streamer. A 5 mm polyethylene trial was inserted and the knee was brought into full extension with steady axial compression applied. Finally, cement was applied to the backside of a 38 mm medialized dome patella and the patellar component was positioned and patellar clamp applied. Excess cement was removed using [employer] fast streamer. After adequate curing of the cement, the tourniquet was deflated after a total tourniquet time of 101 minutes. Hemostasis was achieved using electrocautery. The knee was irrigated with  copious amounts of normal saline using pulsatile lavage followed by 500 ml of Surgiphor and then suctioned dry. 20 mL of 1.3% Exparel and 60 mL of 0.25% Marcaine in 40 mL of normal saline was injected along the posterior capsule, medial and lateral gutters, and along the arthrotomy site. A 5 mm stabilized rotating platform polyethylene insert was inserted and the knee was placed through a range of motion with excellent mediolateral soft tissue balancing appreciated and excellent patellar tracking noted. 2 medium drains were placed in the wound bed and brought out through separate stab incisions. The medial parapatellar portion of the incision was reapproximated using interrupted sutures of #1 Vicryl. Subcutaneous tissue was approximated in layers using first #0 Vicryl followed #2-0 Vicryl. The skin was approximated with skin staples. A sterile dressing was applied.  The patient tolerated the procedure well and was transported to the recovery room in stable condition.    Chioke Noxon P. Holley Bouche., M.D.

## 2021-07-13 NOTE — Transfer of Care (Signed)
Immediate Anesthesia Transfer of Care Note  Patient: Shane Abbott  Procedure(s) Performed: COMPUTER ASSISTED TOTAL KNEE ARTHROPLASTY (Left: Knee)  Patient Location: PACU  Anesthesia Type:Spinal  Level of Consciousness: awake, alert  and oriented  Airway & Oxygen Therapy: Patient Spontanous Breathing and Patient connected to face mask oxygen  Post-op Assessment: Report given to RN and Post -op Vital signs reviewed and stable  Post vital signs: Reviewed and stable  Last Vitals:  Vitals Value Taken Time  BP 138/96 07/13/21 1630  Temp    Pulse 101 07/13/21 1632  Resp 11 07/13/21 1632  SpO2 100 % 07/13/21 1632  Vitals shown include unvalidated device data.  Last Pain:  Vitals:   07/13/21 1027  TempSrc: Temporal  PainSc: 0-No pain         Complications: No notable events documented.

## 2021-07-13 NOTE — Anesthesia Procedure Notes (Addendum)
Spinal  Patient location during procedure: OR Start time: 07/13/2021 12:25 PM End time: 07/13/2021 12:09 PM Reason for block: surgical anesthesia Staffing Performed: resident/CRNA  Resident/CRNA: Nelda Marseille, CRNA Preanesthetic Checklist Completed: patient identified, IV checked, site marked, risks and benefits discussed, surgical consent, monitors and equipment checked, pre-op evaluation and timeout performed Spinal Block Patient position: sitting Prep: Betadine Patient monitoring: heart rate, continuous pulse ox, blood pressure and cardiac monitor Approach: midline Location: L3-4 Injection technique: single-shot Needle Needle type: Whitacre and Introducer  Needle gauge: 25 G Needle length: 9 cm Assessment Sensory level: T10 Events: CSF return Additional Notes Negative paresthesia. Negative blood return. Positive free-flowing CSF. Expiration date of kit checked and confirmed. Patient tolerated procedure well, without complications.

## 2021-07-13 NOTE — H&P (Signed)
The patient has been re-examined, and the chart reviewed, and there have been no interval changes to the documented history and physical.    The risks, benefits, and alternatives have been discussed at length. The patient expressed understanding of the risks benefits and agreed with plans for surgical intervention.  Ronnell Clinger P. Delilah Mulgrew, Jr. M.D.    

## 2021-07-13 NOTE — Plan of Care (Signed)
  Problem: Education: Goal: Knowledge of General Education information will improve Description: Including pain rating scale, medication(s)/side effects and non-pharmacologic comfort measures Outcome: Progressing   Problem: Clinical Measurements: Goal: Ability to maintain clinical measurements within normal limits will improve Outcome: Progressing Goal: Will remain free from infection Outcome: Progressing   Problem: Activity: Goal: Risk for activity intolerance will decrease Outcome: Progressing   Problem: Pain Managment: Goal: General experience of comfort will improve Outcome: Progressing   Problem: Skin Integrity: Goal: Risk for impaired skin integrity will decrease Outcome: Progressing   Problem: Pain Management: Goal: Pain level will decrease with appropriate interventions Outcome: Progressing   Problem: Skin Integrity: Goal: Will show signs of wound healing Outcome: Progressing

## 2021-07-14 ENCOUNTER — Encounter: Payer: Self-pay | Admitting: Orthopedic Surgery

## 2021-07-14 DIAGNOSIS — M1712 Unilateral primary osteoarthritis, left knee: Secondary | ICD-10-CM | POA: Diagnosis not present

## 2021-07-14 MED ORDER — ENOXAPARIN SODIUM 40 MG/0.4ML IJ SOSY: 40.0000 mg | PREFILLED_SYRINGE | INTRAMUSCULAR | 0 refills | Status: DC

## 2021-07-14 MED ORDER — CELECOXIB 200 MG PO CAPS: 200.0000 mg | ORAL_CAPSULE | Freq: Two times a day (BID) | ORAL | 0 refills | Status: DC

## 2021-07-14 MED ORDER — OXYCODONE HCL 5 MG PO TABS: 5.0000 mg | ORAL_TABLET | ORAL | 0 refills | Status: DC | PRN

## 2021-07-14 MED ORDER — TRAMADOL HCL 50 MG PO TABS: 50.0000 mg | ORAL_TABLET | ORAL | 0 refills | Status: DC | PRN

## 2021-07-14 NOTE — Progress Notes (Signed)
Occupational Therapy Evaluation Patient Details Name: Shane Abbott MRN: [id number] DOB: [birth year] Today's Date: 07/14/2021   History of Present Illness Shane Abbott is a 70 y.o. male that presents for COMPUTER ASSISTED TOTAL KNEE ARTHROPLASTY.   Clinical Impression   Shane Abbott was seen for OT evaluation this date. Prior to hospital admission, pt was independent. Pt lives with his wife. Currently pt demonstrates impairments as described below (See OT problem list) which functionally limit his ability to perform ADL/self-care tasks. Pt currently requires Pt required SUP + RW + VCs for functional mobility, ambulating household distance. Pt SUP for LB access for simulated don/doffing socks. Pt would benefit from skilled OT services to address noted impairments and functional limitations (see below for any additional details) in order to maximize safety and independence while minimizing falls risk and caregiver burden. Upon hospital discharge, recommend no further OT services.      Recommendations for follow up therapy are one component of a multi-disciplinary discharge planning process, led by the attending physician.  Recommendations may be updated based on patient status, additional functional criteria and insurance authorization.   Follow Up Recommendations  No OT follow up    Assistance Recommended at Discharge Intermittent Supervision/Assistance  Functional Status Assessment  Patient has had a recent decline in their functional status and demonstrates the ability to make significant improvements in function in a reasonable and predictable amount of time.  Equipment Recommendations   (RW)    Recommendations for Other Services       Precautions / Restrictions Precautions Precautions: Knee Precaution Booklet Issued: No Restrictions Weight Bearing Restrictions: Yes LLE Weight Bearing: Weight bearing as tolerated      Mobility Bed Mobility Overal bed mobility:  Needs Assistance Bed Mobility: Supine to Sit     Supine to sit: Min guard         Transfers Overall transfer level: Needs assistance Equipment used: Rolling walker (2 wheels) Transfers: Sit to/from Stand Sit to Stand: Min guard                 Balance Overall balance assessment: Needs assistance Sitting-balance support: Feet supported;No upper extremity supported Sitting balance-Leahy Scale: Good     Standing balance support: Bilateral upper extremity supported Standing balance-Leahy Scale: Good                             ADL either performed or assessed with clinical judgement   ADL Overall ADL's : Needs assistance/impaired                                       General ADL Comments: Pt required SUP + RW + VCs for functional mobility, ambulating household distance. Pt SUP for LB access for simulated don/doff socks.     Vision         Perception     Praxis      Pertinent Vitals/Pain Pain Assessment: 0-10 Pain Score: 6  Pain Location: LLE Pain Descriptors / Indicators: Discomfort;Sore Pain Intervention(s): Limited activity within patient's tolerance;Ice applied     Hand Dominance     Extremity/Trunk Assessment Upper Extremity Assessment Upper Extremity Assessment: Overall WFL for tasks assessed   Lower Extremity Assessment Lower Extremity Assessment: LLE deficits/detail (s/p TKA)       Communication Communication Communication: No difficulties   Cognition Arousal/Alertness: Awake/alert Behavior During Therapy: [facility]  for tasks assessed/performed Overall Cognitive Status: Within Functional Limits for tasks assessed                                       General Comments       Exercises Exercises: Other exercises Other Exercises Other Exercises: Pt educ re: OT role, polar care, safe DME use, ECS, WB status, adapted dressing Other Exercises: Sit<>stand, ambulate ~ 71ft, polar care educ   Shoulder  Instructions      Home Living Family/patient expects to be discharged to:: Private residence Living Arrangements: Spouse/significant other Available Help at Discharge: Family Type of Home: House Home Access: Stairs to enter [employer] of Steps: 2 Entrance Stairs-Rails: None Home Layout: Two level;Full bath on main level;Able to live on main level with bedroom/bathroom (while recovering) Alternate Level Stairs-Number of Steps: flight   Bathroom Shower/Tub: Occupational psychologist: Standard (w/ toilet riser for height)     Home Equipment: Cane - single point;BSC;Standard Walker          Prior Functioning/Environment Prior Level of Function : Independent/Modified Independent             Mobility Comments: Pt reports no recent falls          OT Problem List: Decreased strength;Decreased range of motion;Decreased activity tolerance;Impaired balance (sitting and/or standing);Decreased knowledge of use of DME or AE;Decreased knowledge of precautions      OT Treatment/Interventions: Self-care/ADL training;Therapeutic exercise;DME and/or AE instruction;Energy conservation;Therapeutic activities    OT Goals(Current goals can be found in the care plan section) Acute Rehab OT Goals Patient Stated Goal: to get better OT Goal Formulation: With patient Time For Goal Achievement: 07/28/21 Potential to Achieve Goals: Good ADL Goals Pt Will Perform Grooming: with modified independence;standing (w/ LRAD prn) Pt Will Perform Lower Body Dressing: with modified independence;sitting/lateral leans (w/ LRAD prn) Pt Will Transfer to Toilet: with modified independence;ambulating;regular height toilet (w/ LRAD prn)  OT Frequency: Min 1X/week   Barriers to D/C:            Co-evaluation              AM-PAC OT "6 Clicks" Daily Activity     Outcome Measure Help from another person eating meals?: None Help from another person taking care of personal grooming?:  A Little Help from another person toileting, which includes using toliet, bedpan, or urinal?: A Little Help from another person bathing (including washing, rinsing, drying)?: A Little Help from another person to put on and taking off regular upper body clothing?: None Help from another person to put on and taking off regular lower body clothing?: A Little 6 Click Score: 20   End of Session Equipment Utilized During Treatment: Rolling walker (2 wheels)  Activity Tolerance: Patient tolerated treatment well Patient left: in chair;with call bell/phone within reach;with chair alarm set;with family/visitor present  OT Visit Diagnosis: Unsteadiness on feet (R26.81);Muscle weakness (generalized) (M62.81)                Time: 8270-7867 OT Time Calculation (min): 37 min Charges:     Shane Abbott, Shane Abbott 07/14/2021, 1:05 PM

## 2021-07-14 NOTE — Progress Notes (Signed)
Post-op dressing removed. , Hemovac removed., and Mini compression dressing applied.    

## 2021-07-14 NOTE — Progress Notes (Signed)
Physical Therapy Treatment Patient Details Name: Shane Abbott MRN: [id number] DOB: 11/15/1950 Today's Date: 07/14/2021   History of Present Illness Shane Abbott is a 70 y.o. male that presents for COMPUTER ASSISTED TOTAL KNEE ARTHROPLASTY.    PT Comments    Pt is making good progress towards goals and is safe to dc this date. Care team messaged. Good endurance with RW. HEP given and reviewed. All questions answered. Stairs performed with safe technique with wife present throughout session.  Recommendations for follow up therapy are one component of a multi-disciplinary discharge planning process, led by the attending physician.  Recommendations may be updated based on patient status, additional functional criteria and insurance authorization.  Follow Up Recommendations  Home health PT     Assistance Recommended at Discharge None  Equipment Recommendations  Rolling walker (2 wheels)    Recommendations for Other Services       Precautions / Restrictions Precautions Precautions: Knee Precaution Booklet Issued: Yes (comment) Restrictions Weight Bearing Restrictions: Yes LLE Weight Bearing: Weight bearing as tolerated     Mobility  Bed Mobility Overal bed mobility: Needs Assistance Bed Mobility: Sit to Supine     Supine to sit: Min guard Sit to supine: Min guard   General bed mobility comments: safe technique with ability to lift surgical leg onto bed.    Transfers Overall transfer level: Needs assistance Equipment used: Rolling walker (2 wheels) Transfers: Sit to/from Stand Sit to Stand: Min guard           General transfer comment: able to stand from low surface. Once standing, upright posture. RW used with good weight acceptance noted    Ambulation/Gait Ambulation/Gait assistance: Min guard Gait Distance (Feet): 200 Feet Assistive device: Rolling walker (2 wheels) Gait Pattern/deviations: Step-through pattern     General Gait Details:  ambulated with reciprocal gait pattern and safe technique. RW used with upright posture noted   Stairs Stairs: Yes Stairs assistance: Min guard Stair Management: No rails;Backwards;With walker Number of Stairs: 2 General stair comments: demonstrated prior to performance. Performed 2 steps x 2 reps. Safe technique with step to gait pattern.   Wheelchair Mobility    Modified Rankin (Stroke Patients Only)       Balance Overall balance assessment: Needs assistance Sitting-balance support: Feet supported;No upper extremity supported Sitting balance-Leahy Scale: Good     Standing balance support: Bilateral upper extremity supported Standing balance-Leahy Scale: Good                              Cognition Arousal/Alertness: Awake/alert Behavior During Therapy: WFL for tasks assessed/performed Overall Cognitive Status: Within Functional Limits for tasks assessed                                          Exercises Total Joint Exercises Goniometric ROM: L knee AAROM: 5-96 degrees Other Exercises Other Exercises: Pt educ re: OT role, polar care, safe DME use, ECS, WB status, adapted dressing Other Exercises: Sit<>stand, ambulate ~ 48ft, polar care educ Other Exercises: seated ther-ex performed and HEP reviewed. Therex including QS, SLR, hip abd/add, heel slides, and LAQ. Al ther-ex performed x 10 reps with cga.    General Comments        Pertinent Vitals/Pain Pain Assessment: 0-10 Pain Score: 5  Pain Location: LLE Pain Descriptors / Indicators: Discomfort;Sore Pain Intervention(s):  Limited activity within patient's tolerance;Repositioned;Ice applied    Home Living Family/patient expects to be discharged to:: Private residence Living Arrangements: Spouse/significant other Available Help at Discharge: Family Type of Home: House Home Access: Stairs to enter Entrance Stairs-Rails: None Entrance Stairs-Number of Steps: 2 Alternate Level  Stairs-Number of Steps: flight Home Layout: Two level;Full bath on main level;Able to live on main level with bedroom/bathroom (while recovering) Home Equipment: Cane - single point;BSC;Standard Walker      Prior Function            PT Goals (current goals can now be found in the care plan section) Acute Rehab PT Goals Patient Stated Goal: to go home PT Goal Formulation: With patient Time For Goal Achievement: 07/28/21 Potential to Achieve Goals: Good Additional Goals Additional Goal #1: Pt will be able to perform bed mobility/transfers with mod I and safe technique in order to improve functional independence Progress towards PT goals: Progressing toward goals    Frequency    BID      PT Plan Current plan remains appropriate    Co-evaluation              AM-PAC PT "6 Clicks" Mobility   Outcome Measure  Help needed turning from your back to your side while in a flat bed without using bedrails?: A Little Help needed moving from lying on your back to sitting on the side of a flat bed without using bedrails?: A Little Help needed moving to and from a bed to a chair (including a wheelchair)?: A Little Help needed standing up from a chair using your arms (e.g., wheelchair or bedside chair)?: A Little Help needed to walk in hospital room?: A Little Help needed climbing 3-5 steps with a railing? : A Little 6 Click Score: 18    End of Session Equipment Utilized During Treatment: Gait belt Activity Tolerance: Patient tolerated treatment well Patient left: in bed;with bed alarm set;with SCD's reapplied Nurse Communication: Mobility status PT Visit Diagnosis: Difficulty in walking, not elsewhere classified (R26.2);Pain;Muscle weakness (generalized) (M62.81) Pain - Right/Left: Left Pain - part of body: Knee     Time: 5277-8242 PT Time Calculation (min) (ACUTE ONLY): 35 min  Charges:  $Gait Training: 8-22 mins $Therapeutic Exercise: 8-22 mins                      Greggory Stallion, PT, DPT [phone]    Cem Kosman 07/14/2021, 2:56 PM

## 2021-07-14 NOTE — Anesthesia Postprocedure Evaluation (Signed)
Anesthesia Post Note  Patient: Shane Abbott  Procedure(s) Performed: COMPUTER ASSISTED TOTAL KNEE ARTHROPLASTY (Left: Knee)  Patient location during evaluation: PACU Anesthesia Type: Spinal Level of consciousness: oriented and awake and alert Pain management: pain level controlled Vital Signs Assessment: post-procedure vital signs reviewed and stable Respiratory status: spontaneous breathing, respiratory function stable and patient connected to nasal cannula oxygen Cardiovascular status: blood pressure returned to baseline and stable Postop Assessment: no headache, no backache and no apparent nausea or vomiting Anesthetic complications: no   No notable events documented.   Last Vitals:  Vitals:   07/14/21 0541 07/14/21 0722  BP: 98/60 114/66  Pulse: 76 81  Resp: 16 16  Temp: 36.7 C 37 C  SpO2: 97% 97%    Last Pain:  Vitals:   07/14/21 0722  TempSrc: Oral  PainSc:                  Jerrye Noble

## 2021-07-14 NOTE — Progress Notes (Addendum)
  Subjective: 1 Day Post-Op Procedure(s) (LRB): COMPUTER ASSISTED TOTAL KNEE ARTHROPLASTY (Left) Wife at bedside. Patient reports pain as mild.   Patient is well, and has had no acute complaints or problems Plan is to go Home after hospital stay. Negative for chest pain and shortness of breath Fever: no Gastrointestinal: negative for nausea and vomiting.  Patient has had a bowel movement.  Objective: Vital signs in last 24 hours: Temp:  [96.8 F (36 C)-98.6 F (37 C)] 98.6 F (37 C) (10/25 0722) Pulse Rate:  [76-100] 81 (10/25 0722) Resp:  [10-18] 16 (10/25 0722) BP: (98-148)/(60-96) 114/66 (10/25 0722) SpO2:  [97 %-100 %] 97 % (10/25 0722) Weight:  [87.6 kg] 87.6 kg (10/24 1740)  Intake/Output from previous day:  Intake/Output Summary (Last 24 hours) at 07/14/2021 0801 Last data filed at 07/14/2021 0542 Gross per 24 hour  Intake 2420 ml  Output 2775 ml  Net -355 ml    Intake/Output this shift: No intake/output data recorded.  Labs: No results for input(s): HGB in the last 72 hours. No results for input(s): WBC, RBC, HCT, PLT in the last 72 hours. No results for input(s): NA, K, CL, CO2, BUN, CREATININE, GLUCOSE, CALCIUM in the last 72 hours. No results for input(s): LABPT, INR in the last 72 hours.   EXAM General - Patient is Alert, Appropriate, and Oriented Extremity - sensation decreased over the deep fibular, plantar surface of foot Dorsiflexion/Plantar flexion intact Compartment soft Dressing/Incision -Postoperative dressing remains in place., Polar Care in place and working. , Hemovac in place.  Motor Function - intact, moving foot and toes well on exam. Able to perform independent SLR.  Cardiovascular- Regular rate and rhythm, no murmurs/rubs/gallops Respiratory- Lungs clear to auscultation bilaterally Gastrointestinal- soft, nontender, and active bowel sounds   Assessment/Plan: 1 Day Post-Op Procedure(s) (LRB): COMPUTER ASSISTED TOTAL KNEE ARTHROPLASTY  (Left) Active Problems:   Total knee replacement status  Estimated body mass index is 26.19 kg/m as calculated from the following:   Height as of this encounter: 6' (1.829 m).   Weight as of this encounter: 87.6 kg. Advance diet Up with therapy  Possible d/c today pending completion of therapy goals.      DVT Prophylaxis - Lovenox, Ted hose, and foot pumps Weight-Bearing as tolerated to left leg  Cassell Smiles, PA-C [facility] 07/14/2021, 8:01 AM

## 2021-07-14 NOTE — Evaluation (Signed)
Physical Therapy Evaluation Patient Details Name: Shane Abbott MRN: [id number] DOB: 03-21-1951 Today's Date: 07/14/2021  History of Present Illness  Shane Abbott is a 70 y.o. male that presents for COMPUTER ASSISTED TOTAL KNEE ARTHROPLASTY.  Clinical Impression  Pt is a pleasant 70 year old male who was admitted for L TKR. Pt performs bed mobility, transfers, and ambulation with cga and RW. Pt demonstrates ability to perform 10 SLRs with independence, therefore does not require KI for mobility. Pt demonstrates deficits with strength/mobility/pain. Would benefit from skilled PT to address above deficits and promote optimal return to PLOF. Recommend transition to [facility] upon discharge from acute hospitalization. Pt is hopeful for possible dc today. Plan for stair training in PM. Still reports slight numbness in L foot, will continue to monitor.      Recommendations for follow up therapy are one component of a multi-disciplinary discharge planning process, led by the attending physician.  Recommendations may be updated based on patient status, additional functional criteria and insurance authorization.  Follow Up Recommendations Home health PT    Assistance Recommended at Discharge None  Functional Status Assessment Patient has had a recent decline in their functional status and demonstrates the ability to make significant improvements in function in a reasonable and predictable amount of time.  Equipment Recommendations  Rolling walker (2 wheels)    Recommendations for Other Services       Precautions / Restrictions Precautions Precautions: Knee Precaution Booklet Issued: No Restrictions Weight Bearing Restrictions: Yes LLE Weight Bearing: Weight bearing as tolerated      Mobility  Bed Mobility Overal bed mobility: Needs Assistance Bed Mobility: Supine to Sit     Supine to sit: Min guard     General bed mobility comments: safe technique and follows commands well.  UPright posture once seated    Transfers Overall transfer level: Needs assistance Equipment used: Rolling walker (2 wheels) Transfers: Sit to/from Stand Sit to Stand: Min guard           General transfer comment: able to stand from low surface. Once standing, upright posture. RW used with good weight acceptance noted    Ambulation/Gait Ambulation/Gait assistance: Min guard Gait Distance (Feet): 200 Feet Assistive device: Rolling walker (2 wheels) Gait Pattern/deviations: Step-through pattern     General Gait Details: initially step to gait pattern however improved to reciprocal gait pattern with good heel strike. Cues for keeping RW close to body and upright posture as he tends to look down.  Stairs            Wheelchair Mobility    Modified Rankin (Stroke Patients Only)       Balance Overall balance assessment: Needs assistance Sitting-balance support: Feet supported;Single extremity supported Sitting balance-Leahy Scale: Good     Standing balance support: Bilateral upper extremity supported Standing balance-Leahy Scale: Good                               Pertinent Vitals/Pain Pain Assessment: 0-10 Pain Score: 6  Pain Location: LLE Pain Descriptors / Indicators: Discomfort;Sore Pain Intervention(s): Limited activity within patient's tolerance;Ice applied    Home Living Family/patient expects to be discharged to:: Private residence Living Arrangements: Spouse/significant other Available Help at Discharge: Family Type of Home: House Home Access: Stairs to enter Entrance Stairs-Rails: None Entrance Stairs-Number of Steps: 2 Alternate Level Stairs-Number of Steps: flight Home Layout: Two level;Full bath on main level;Able to live on main level  with bedroom/bathroom (while recovering) Home Equipment: Cane - single point;BSC;Standard Walker      Prior Function Prior Level of Function : Independent/Modified Independent              Mobility Comments: Pt reports no recent falls       Hand Dominance        Extremity/Trunk Assessment   Upper Extremity Assessment Upper Extremity Assessment: Overall WFL for tasks assessed    Lower Extremity Assessment Lower Extremity Assessment: LLE deficits/detail (s/p)       Communication   Communication: No difficulties  Cognition Arousal/Alertness: Awake/alert Behavior During Therapy: WFL for tasks assessed/performed Overall Cognitive Status: Within Functional Limits for tasks assessed                                          General Comments      Exercises Total Joint Exercises Goniometric ROM: L knee AAROM: 5-96 degrees Other Exercises Other Exercises: supine ther-ex performed on L LE including QS, AP, SLRs, and hip abd/add. 10 reps performed with cga. Other Exercises: educated on correct [place]- does report slight numbness in L foot-post surgical   Assessment/Plan    PT Assessment Patient needs continued PT services  PT Problem List Decreased strength;Decreased range of motion;Decreased balance;Decreased mobility;Decreased knowledge of use of DME;Pain       PT Treatment Interventions DME instruction;Gait training;Stair training;Therapeutic exercise    PT Goals (Current goals can be found in the Care Plan section)  Acute Rehab PT Goals Patient Stated Goal: to go home PT Goal Formulation: With patient Time For Goal Achievement: 07/28/21 Potential to Achieve Goals: Good    Frequency BID   Barriers to discharge        Co-evaluation               AM-PAC PT "6 Clicks" Mobility  Outcome Measure Help needed turning from your back to your side while in a flat bed without using bedrails?: A Little Help needed moving from lying on your back to sitting on the side of a flat bed without using bedrails?: A Little Help needed moving to and from a bed to a chair (including a wheelchair)?: A Little Help needed standing up from a chair  using your arms (e.g., wheelchair or bedside chair)?: A Little Help needed to walk in hospital room?: A Little Help needed climbing 3-5 steps with a railing? : A Little 6 Click Score: 18    End of Session Equipment Utilized During Treatment: Gait belt Activity Tolerance: Patient tolerated treatment well Patient left: in chair;with chair alarm set;with SCD's reapplied Nurse Communication: Mobility status PT Visit Diagnosis: Difficulty in walking, not elsewhere classified (R26.2);Pain;Muscle weakness (generalized) (M62.81) Pain - Right/Left: Left Pain - part of body: Knee    Time: 5852-7782 PT Time Calculation (min) (ACUTE ONLY): 39 min   Charges:   PT Evaluation $PT Eval Low Complexity: 1 Low PT Treatments $Gait Training: 8-22 mins $Therapeutic Exercise: 8-22 mins        Greggory Stallion, PT, DPT [phone]   Shervon Kerwin 07/14/2021, 12:55 PM

## 2021-07-14 NOTE — TOC Initial Note (Signed)
Transition of Care [facility]) - Initial/Assessment Note    Patient Details  Name: Shane Abbott MRN: [id number] Date of Birth: [birth year]  [facility]) CM/SW Contact:    Candie Chroman, LCSW Phone Number: 07/14/2021, 2:36 PM  Clinical Narrative:   CSW met with patient. Wife at bedside. CSW introduced role and explained that PT recommendations would be discussed. Patient was set up with [facility] prior to admission. He is agreeable. PT recommending a rolling walker but patient declined. He states he has DME at home already that he can use. No further concerns. CSW encouraged patient and his wife to contact CSW as needed. CSW will continue to follow patient for support and facilitate return home when stable.               Expected Discharge Plan: [place] Barriers to Discharge: Continued Medical Work up   Patient Goals and CMS Choice     Choice offered to / list presented to : Patient, Spouse  Expected Discharge Plan and Services Expected Discharge Plan: [place]     Post Acute Care Choice: [place] arrangements for the past 2 months: Single Family Home                           HH Arranged: PT [place]: [place] Date [place]: 07/14/21   Representative spoke with at [place]: Was set up prior to admission.  Prior Living Arrangements/Services Living arrangements for the past 2 months: Single Family Home Lives with:: Spouse Patient language and need for interpreter reviewed:: Yes Do you feel safe going back to the place where you live?: Yes      Need for Family Participation in Patient Care: Yes (Comment) Care giver support system in place?: Yes (comment) Current home services: DME Criminal Activity/Legal Involvement Pertinent to Current Situation/Hospitalization: No - Comment as needed  Activities of Daily Living Home Assistive Devices/Equipment: Dentures (specify type) ADL  Screening (condition at time of admission) Patient's cognitive ability adequate to safely complete daily activities?: Yes Is the patient deaf or have difficulty hearing?: No Does the patient have difficulty seeing, even when wearing glasses/contacts?: No Does the patient have difficulty concentrating, remembering, or making decisions?: No Patient able to express need for assistance with ADLs?: No Does the patient have difficulty dressing or bathing?: No Independently performs ADLs?: Yes (appropriate for developmental age) Does the patient have difficulty walking or climbing stairs?: No Weakness of Legs: Both Weakness of Arms/Hands: Both  Permission Sought/Granted Permission sought to share information with : Facility Art therapist granted to share information with : Yes, Verbal Permission Granted     Permission granted to share info w AGENCY: [facility]        Emotional Assessment Appearance:: Appears stated age Attitude/Demeanor/Rapport: Engaged, Gracious Affect (typically observed): Accepting, Appropriate, Calm, Pleasant Orientation: : Oriented to Self, Oriented to Place, Oriented to  Time, Oriented to Situation Alcohol / Substance Use: Not Applicable Psych Involvement: No (comment)  Admission diagnosis:  Total knee replacement status [Z96.659] Patient Active Problem List   Diagnosis Date Noted   Total knee replacement status 07/13/2021   Primary osteoarthritis of left knee 02/18/2021   Kidney stones 10/09/2020   Erectile dysfunction 10/10/2019   Elevated PSA    Hyperlipidemia    Prediabetes 01/17/2017   Screening for diabetes mellitus 12/29/2016   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 [place] [address] Phone: [phone] Fax: [phone]     Social Determinants of Health (SDOH) Interventions    Readmission Risk Interventions No flowsheet data found.   

## 2021-07-14 NOTE — Progress Notes (Signed)
VSS, IV cath removed site c/d/I pt sent home with honeycomb dressings, bone foam, polar care and teds placed. [facility] [place]. Discussed d/c packet with wife and pt at bedside where they verbalized their understanding of the information provided. Pt transported via w/c with all belongings down to personal car.

## 2021-07-14 NOTE — TOC Transition Note (Signed)
Transition of Care [facility]) - CM/SW Discharge Note   Patient Details  Name: Shane Abbott MRN: [id number] Date of Birth: 03/01/1951  [facility]) CM/SW Contact:  Candie Chroman, LCSW Phone Number: 07/14/2021, 4:14 PM   Clinical Narrative:  Patient has orders to discharge home today. Left message for [facility] representative to notify. No further concerns. CSW signing off.   Final next level of care: [place] Barriers to Discharge: No Barriers Identified   Patient Goals and CMS Choice     Choice offered to / list presented to : Patient, Spouse  Discharge Placement                  Name of family member notified: Deniece Portela Patient and family notified of of transfer: 07/14/21  Discharge Plan and Services     Post Acute Care Choice: [place]: PT [place]: [place] Date [place]: 07/14/21   Representative spoke with at [place]: [place]  Social Determinants of Health ([place]) Interventions     Readmission Risk Interventions No flowsheet data found.

## 2021-07-14 NOTE — Discharge Summary (Signed)
Physician Discharge Summary  Patient ID: Shane Abbott MRN: [id number] DOB/AGE: [birth year] 70 y.o.  Admit date: 07/13/2021 Discharge date: 07/14/2021  Admission Diagnoses:  Total knee replacement status [Z96.659]  Surgeries:Procedure(s): Left total knee arthroplasty using computer-assisted navigation   SURGEON:  Marciano Sequin. M.D.   ASSISTANT: Cassell Smiles, PA-C (present and scrubbed throughout the case, critical for assistance with exposure, retraction, instrumentation, and closure)   ANESTHESIA: spinal   ESTIMATED BLOOD LOSS: 100 mL   FLUIDS REPLACED: 1800 mL of crystalloid   TOURNIQUET TIME: 101 minutes   DRAINS: 2 medium Hemovac drains   SOFT TISSUE RELEASES: Anterior cruciate ligament, posterior cruciate ligament, deep  medial collateral ligament, patellofemoral ligament   IMPLANTS UTILIZED: DePuy Attune size 6 posterior stabilized femoral component (cemented), size 8 rotating platform tibial component (cemented), 38 mm medialized dome patella (cemented), and a 5 mm stabilized rotating platform polyethylene insert.  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 male who was admitted 07/13/2021 with a diagnosis of left knee osteoarthritis and went to the operating room on 07/13/2021 and underwent left total knee arthoplasty. The patient received perioperative antibiotics for prophylaxis (see below). The patient tolerated the procedure well and was transported to PACU in stable condition. After meeting PACU criteria, the patient was subsequently transferred to the Orthopaedics/Rehabilitation unit.   The patient received DVT prophylaxis in the form of early mobilization, Lovenox, Foot Pumps, and TED hose. A sacral pad had been placed and heels were elevated off of the bed with rolled towels in order to protect skin integrity. Foley catheter was discontinued on postoperative day #0. Wound drains were discontinued on postoperative day #1. The surgical incision was healing well without signs of infection.  Physical therapy was initiated postoperatively for transfers, gait training, and strengthening. Occupational therapy was initiated for activities of daily living and evaluation for assisted devices. Rehabilitation goals were reviewed in detail with the patient. The patient made steady progress with physical therapy and physical therapy recommended discharge to Home.   The patient achieved the preliminary goals of this hospitalization and was felt to be medically and orthopaedically appropriate for discharge.  He was given perioperative antibiotics:  Anti-infectives (From admission, onward)    Start     Dose/Rate Route Frequency Ordered Stop   07/13/21 1830  ceFAZolin (ANCEF) IVPB 2g/100 mL premix        2 g 200 mL/hr over 30 Minutes Intravenous Every 6 hours 07/13/21 1733 07/14/21 0126   07/13/21 1019  ceFAZolin (ANCEF) 2-4 GM/100ML-% IVPB  Note to Pharmacy: Trudie Reed   : cabinet override      07/13/21 1019 07/13/21 1317   07/13/21 0600  ceFAZolin (ANCEF) IVPB 2g/100 mL premix        2 g 200 mL/hr over 30 Minutes  Intravenous On call to O.R. 07/13/21 0220 07/13/21 1255     .  Recent vital signs:  Vitals:   07/14/21 0541 07/14/21 0722  BP: 98/60 114/66  Pulse: 76 81  Resp: 16 16  Temp: 98 F (36.7 C) 98.6 F (37 C)  SpO2: 97% 97%    Recent laboratory studies:  No results for input(s): WBC, HGB, HCT, PLT, K, CL, CO2, BUN, CREATININE, GLUCOSE, CALCIUM, LABPT, INR in the last 72 hours.  Diagnostic Studies: DG Knee Left Port  Result Date: 07/13/2021 CLINICAL DATA:  Postop knee surgery EXAM: PORTABLE LEFT KNEE - 1-2 VIEW COMPARISON:  02/19/2020 FINDINGS: Status post left knee replacement with intact hardware and no fracture. Surgical drainage catheter in the suprapatellar soft tissues. Gas in the soft tissues consistent with recent surgery. Probable ghost tracks in the distal femur and proximal tibia IMPRESSION: Status post left knee replacement with expected postsurgical change Electronically Signed   By: Donavan Foil M.D.   On: 07/13/2021 17:39    Discharge Medications:   Allergies as of 07/14/2021   No Known Allergies      Medication List     STOP taking these medications    ibuprofen 200 MG tablet Commonly known as: ADVIL       TAKE these medications    acetaminophen 500 MG tablet Commonly known as: TYLENOL Take 1,000 mg by mouth every 8 (eight) hours as needed for moderate pain.   celecoxib 200 MG capsule Commonly known as: CeleBREX Take 1 capsule (200 mg total) by mouth 2 (two) times daily.   enoxaparin 40 MG/0.4ML injection Commonly known as: LOVENOX Inject 0.4 mLs (40 mg total) into the skin daily for 14 days.   Multi-Vitamins Tabs Take 1 tablet by mouth daily.   oxyCODONE 5 MG immediate release tablet Commonly known as: Roxicodone Take 1 tablet (5 mg total) by mouth every 4 (four) hours as needed for severe pain.   traMADol 50 MG tablet Commonly known as: ULTRAM Take 1 tablet (50 mg total) by mouth every 4 (four) hours as needed for moderate pain.                Durable Medical Equipment  (From admission, onward)           Start     Ordered   07/13/21 1734  DME Walker rolling  Once       Question:  Patient needs a walker to treat with the following condition  Answer:  Total knee replacement status   07/13/21 1733   07/13/21 1734  DME Bedside commode  Once       Question:  Patient needs a bedside commode to treat with the following condition  Answer:  Total knee replacement status   07/13/21 1733            Disposition: Home with home health PT     Follow-up Information     Fausto Skillern, PA-C Follow up on 07/28/2021.   Specialty: Orthopedic Surgery Why: at 9:45am Contact information: [place] [address] [phone]         Dereck Leep, MD Follow up on 09/01/2021.   Specialty: Orthopedic Surgery  Why: at 2:45pm Contact information: [address] [facility] [place] [address] [place], [place] Follow up.   Specialty: Home Health Services Why: They will follow up with you for your home health needs. Contact information: [place] [address] [phone]                  Tamala Julian, PA-C 07/14/2021, 3:47 PM

## 2021-10-08 ENCOUNTER — Other Ambulatory Visit: Payer: Self-pay

## 2021-10-08 DIAGNOSIS — R972 Elevated prostate specific antigen [PSA]: Secondary | ICD-10-CM

## 2021-10-09 ENCOUNTER — Other Ambulatory Visit: Payer: Self-pay

## 2021-10-09 ENCOUNTER — Other Ambulatory Visit: Payer: Medicare HMO

## 2021-10-09 DIAGNOSIS — R972 Elevated prostate specific antigen [PSA]: Secondary | ICD-10-CM

## 2021-10-10 LAB — PSA: Prostate Specific Ag, Serum: 3.9 ng/mL (ref 0.0–4.0)

## 2021-10-14 ENCOUNTER — Ambulatory Visit: Payer: Medicare HMO | Admitting: Urology

## 2021-10-14 ENCOUNTER — Encounter: Payer: Self-pay | Admitting: Urology

## 2021-10-14 ENCOUNTER — Other Ambulatory Visit: Payer: Self-pay

## 2021-10-14 VITALS — BP 136/78 | HR 71 | Ht 71.0 in | Wt 190.0 lb

## 2021-10-14 DIAGNOSIS — N529 Male erectile dysfunction, unspecified: Secondary | ICD-10-CM

## 2021-10-14 DIAGNOSIS — R972 Elevated prostate specific antigen [PSA]: Secondary | ICD-10-CM

## 2021-10-14 MED ORDER — TADALAFIL 5 MG PO TABS: 5.0000 mg | ORAL_TABLET | Freq: Every day | ORAL | 3 refills | Status: DC | PRN

## 2021-10-14 NOTE — Progress Notes (Signed)
° °  10/14/2021 10:50 AM   Shane Abbott 04/25/1951 [id number]  Reason for visit: Follow up PSA screening, ED  HPI: Very healthy 71 year old male with no family history of prostate cancer who we have followed for PSA screening as well as ED.  Baseline PSA has ranged from 3.5-4, and DRE has been benign.  He had an isolated elevated value of 6.2 in January 2022, but this dropped back down to baseline of 3.82 weeks later on repeat testing.  PSA this year is stable at 3.9.  We reviewed the risks and benefits of PSA screening, as well as the AUA guidelines.  He is extremely healthy, and it may be reasonable to continue PSA screening through ~75.  He recently had a knee replacement and has done well after this.  He is not been sexually active since that time.  He wonders if he should try the Cialis again prior to being sexually active.  I think this is not unreasonable, and I sent in Cialis 5 mg to try as he resumes sexual activity in the next few months.  Trial of Cialis 5 mg as needed for ED RTC 1 year PSA reflex to free prior   Billey Co, MD  [facility] [address], [place], [place] [address] [phone]

## 2021-10-14 NOTE — Patient Instructions (Signed)
Prostate Cancer Screening ?Prostate cancer screening is testing that is done to check for the presence of prostate cancer in men. The prostate gland is a walnut-sized gland that is located below the bladder and in front of the rectum in males. The function of the prostate is to add fluid to semen during ejaculation. Prostate cancer is one of the most common types of cancer in men. ?Who should have prostate cancer screening? ?Screening recommendations vary based on age and other risk factors, as well as between the professional organizations who make the recommendations. ?In general, screening is recommended if: ?You are age 50 to 70 and have an average risk for prostate cancer. You should talk with your health care provider about your need for screening and how often screening should be done. Because most prostate cancers are slow growing and will not cause death, screening in this age group is generally reserved for men who have a 10- to 15-year life expectancy. ?You are younger than age 50, and you have these risk factors: ?Having a father, brother, or uncle who has been diagnosed with prostate cancer. The risk is higher if your family member's cancer occurred at an early age or if you have multiple family members with prostate cancer at an early age. ?Being a male who is Black or is of Caribbean or sub-Saharan African descent. ?In general, screening is not recommended if: ?You are younger than age 40. ?You are between the ages of 40 and 49 and you have no risk factors. ?You are 70 years of age or older. At this age, the risks that screening can cause are greater than the benefits that it may provide. ?If you are at high risk for prostate cancer, your health care provider may recommend that you have screenings more often or that you start screening at a younger age. ?How is screening for prostate cancer done? ?The recommended prostate cancer screening test is a blood test called the prostate-specific antigen (PSA)  test. PSA is a protein that is made in the prostate. As you age, your prostate naturally produces more PSA. Abnormally high PSA levels may be caused by: ?Prostate cancer. ?An enlarged prostate that is not caused by cancer (benign prostatic hyperplasia, or BPH). This condition is very common in older men. ?A prostate gland infection (prostatitis) or urinary tract infection. ?Certain medicines such as male hormones (like testosterone) or other medicines that raise testosterone levels. ?A rectal exam may be done as part of prostate cancer screening to help provide information about the size of your prostate gland. When a rectal exam is performed, it should be done after the PSA level is drawn to avoid any effect on the results. ?Depending on the PSA results, you may need more tests, such as: ?A physical exam to check the size of your prostate gland, if not done as part of screening. ?Blood and imaging tests. ?A procedure to remove tissue samples from your prostate gland for testing (biopsy). This is the only way to know for certain if you have prostate cancer. ?What are the benefits of prostate cancer screening? ?Screening can help to identify cancer at an early stage, before symptoms start and when the cancer can be treated more easily. ?There is a small chance that screening may lower your risk of dying from prostate cancer. The chance is small because prostate cancer is a slow-growing cancer, and most men with prostate cancer die from a different cause. ?What are the risks of prostate cancer screening? ?The main   risk of prostate cancer screening is diagnosing and treating prostate cancer that would never have caused any symptoms or problems. This is called overdiagnosisand overtreatment. PSA screening cannot tell you if your PSA is high due to cancer or a different cause. A prostate biopsy is the only procedure to diagnose prostate cancer. Even the results of a biopsy may not tell you if your cancer needs to be  treated. Slow-growing prostate cancer may not need any treatment other than monitoring, so diagnosing and treating it may cause unnecessary stress or other side effects. ?Questions to ask your health care provider ?When should I start prostate cancer screening? ?What is my risk for prostate cancer? ?How often do I need screening? ?What type of screening tests do I need? ?How do I get my test results? ?What do my results mean? ?Do I need treatment? ?Where to find more information ?The American Cancer Society: www.cancer.org ?American Urological Association: www.auanet.org ?Contact a health care provider if: ?You have difficulty urinating. ?You have pain when you urinate or ejaculate. ?You have blood in your urine or semen. ?You have pain in your back or in the area of your prostate. ?Summary ?Prostate cancer is a common type of cancer in men. The prostate gland is located below the bladder and in front of the rectum. This gland adds fluid to semen during ejaculation. ?Prostate cancer screening may identify cancer at an early stage, when the cancer can be treated more easily and is less likely to have spread to other areas of the body. ?The prostate-specific antigen (PSA) test is the recommended screening test for prostate cancer, but it has associated risks. ?Discuss the risks and benefits of prostate cancer screening with your health care provider. If you are age 70 or older, the risks that screening can cause are greater than the benefits that it may provide. ?This information is not intended to replace advice given to you by your health care provider. Make sure you discuss any questions you have with your health care provider. ?Document Revised: 03/02/2021 Document Reviewed: 03/02/2021 ?Elsevier Patient Education ? 2022 Elsevier Inc. ? ?

## 2021-10-16 ENCOUNTER — Telehealth: Payer: Self-pay | Admitting: Anesthesiology

## 2021-10-16 NOTE — Progress Notes (Addendum)
Anesthesia was notified by Dr. 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 After chart review, there are no indications of having complications during spinal placement such as paresthesias. Pt was not noted to have neuro deficits on post-op check. It was noted by OT that patient had left foot numbness the day of discharge but anesthesia was not notified based off my chart review. I talked to Shane Abbott today, who said he has numbness on the bottom of his foot with inability to flex toes. He states it has been the same since surgery. Dr. Marry Guan mentioned that he will have Shane Abbott see a neurologist. I agree with this consult. I told Shane Abbott that a spinal anesthetic is unlikely to be the cause of his nerve injury but a Neurology consult will help provide options to evaluate the nerve lesion with EMG/nerve conduction studies. Pt understood and will follow up with Dr. Clydell Hakim office to make sure a consult was placed.   Shane Offer MD Anesthesia

## 2021-11-10 ENCOUNTER — Other Ambulatory Visit: Payer: Self-pay | Admitting: Neurology

## 2021-11-10 DIAGNOSIS — R260 Ataxic gait: Secondary | ICD-10-CM

## 2021-11-10 DIAGNOSIS — R202 Paresthesia of skin: Secondary | ICD-10-CM

## 2021-11-17 ENCOUNTER — Ambulatory Visit
Admission: RE | Admit: 2021-11-17 | Discharge: 2021-11-17 | Disposition: A | Discharge status: Home / Self Care | Payer: Medicare HMO | Source: Ambulatory Visit | Attending: Neurology | Admitting: Neurology

## 2021-11-17 ENCOUNTER — Other Ambulatory Visit: Payer: Self-pay

## 2021-11-17 DIAGNOSIS — R202 Paresthesia of skin: Secondary | ICD-10-CM | POA: Diagnosis not present

## 2021-11-17 DIAGNOSIS — R260 Ataxic gait: Secondary | ICD-10-CM | POA: Diagnosis present

## 2021-12-18 ENCOUNTER — Inpatient Hospital Stay: Payer: Medicare HMO

## 2021-12-18 ENCOUNTER — Encounter: Payer: Self-pay | Admitting: Oncology

## 2021-12-18 ENCOUNTER — Inpatient Hospital Stay: Payer: Medicare HMO | Attending: Oncology | Admitting: Oncology

## 2021-12-18 VITALS — BP 151/81 | HR 66 | Temp 98.3°F | Resp 18 | Wt 194.1 lb

## 2021-12-18 DIAGNOSIS — D472 Monoclonal gammopathy: Secondary | ICD-10-CM | POA: Insufficient documentation

## 2021-12-18 DIAGNOSIS — R202 Paresthesia of skin: Secondary | ICD-10-CM | POA: Diagnosis not present

## 2021-12-18 LAB — CBC WITH DIFFERENTIAL/PLATELET
Abs Immature Granulocytes: 0.01 10*3/uL (ref 0.00–0.07)
Basophils Absolute: 0 10*3/uL (ref 0.0–0.1)
Basophils Relative: 1 %
Eosinophils Absolute: 0.1 10*3/uL (ref 0.0–0.5)
Eosinophils Relative: 1 %
HCT: 40.9 % (ref 39.0–52.0)
Hemoglobin: 13.5 g/dL (ref 13.0–17.0)
Immature Granulocytes: 0 %
Lymphocytes Relative: 25 %
Lymphs Abs: 1.3 10*3/uL (ref 0.7–4.0)
MCH: 28.7 pg (ref 26.0–34.0)
MCHC: 33 g/dL (ref 30.0–36.0)
MCV: 86.8 fL (ref 80.0–100.0)
Monocytes Absolute: 0.5 10*3/uL (ref 0.1–1.0)
Monocytes Relative: 10 %
Neutro Abs: 3.3 10*3/uL (ref 1.7–7.7)
Neutrophils Relative %: 63 %
Platelets: 230 10*3/uL (ref 150–400)
RBC: 4.71 MIL/uL (ref 4.22–5.81)
RDW: 15.6 % — ABNORMAL HIGH (ref 11.5–15.5)
WBC: 5.2 10*3/uL (ref 4.0–10.5)
nRBC: 0 % (ref 0.0–0.2)

## 2021-12-18 LAB — COMPREHENSIVE METABOLIC PANEL
ALT: 16 U/L (ref 0–44)
AST: 16 U/L (ref 15–41)
Albumin: 3.9 g/dL (ref 3.5–5.0)
Alkaline Phosphatase: 57 U/L (ref 38–126)
Anion gap: 5 (ref 5–15)
BUN: 15 mg/dL (ref 8–23)
CO2: 25 mmol/L (ref 22–32)
Calcium: 8.9 mg/dL (ref 8.9–10.3)
Chloride: 106 mmol/L (ref 98–111)
Creatinine, Ser: 1.07 mg/dL (ref 0.61–1.24)
GFR, Estimated: >60 mL/min (ref >60)
Glucose, Bld: 115 mg/dL — ABNORMAL HIGH (ref 70–99)
Potassium: 3.9 mmol/L (ref 3.5–5.1)
Sodium: 136 mmol/L (ref 135–145)
Total Bilirubin: 0.9 mg/dL (ref 0.3–1.2)
Total Protein: 7.1 g/dL (ref 6.5–8.1)

## 2021-12-18 LAB — TECHNOLOGIST SMEAR REVIEW
Plt Morphology: NORMAL
RBC MORPHOLOGY: NORMAL
WBC MORPHOLOGY: NORMAL

## 2021-12-18 LAB — LACTATE DEHYDROGENASE: LDH: 130 U/L (ref 98–192)

## 2021-12-18 NOTE — Progress Notes (Signed)
?Hematology/Oncology Consult note ?[phone] [phone] ?  ? ?   ? ? ?Patient Care Team: ?Adin Hector, MD as PCP - General (Internal Medicine) ? ?REFERRING PROVIDER: ?Vladimir Crofts, MD  ?CHIEF COMPLAINTS/REASON FOR VISIT:  ?Evaluation of abnormal SPEP ? ?HISTORY OF PRESENTING ILLNESS:  ? ?Shane Abbott is a  71 y.o.  male with PMH listed below was seen in consultation at the request of  Vladimir Crofts, MD  for evaluation of abnormal SPEP ? ?Patient reports starting to experience left foot weakness, sensation changes after his left total knee replacement in October 2022.   ?Patient was seen by neurology Dr. Manuella Ghazi recently for work-up.  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  Patient was referred to hematology for further evaluation. ? ?Otherwise patient reports feeling well at baseline.  Denies unintentional weight loss, night sweats, fever. ? ? ?Review of Systems  ?Constitutional:  Negative for appetite change, chills, diaphoresis, fatigue, fever and unexpected weight change.  ?HENT:   Negative for hearing loss, lump/mass, nosebleeds, sore throat and voice change.   ?Eyes:  Negative for eye problems and icterus.  ?Respiratory:  Negative for chest tightness, cough, hemoptysis, shortness of breath and wheezing.   ?Cardiovascular:  Negative for chest pain and leg swelling.  ?Gastrointestinal:  Negative for abdominal distention, abdominal pain, blood in stool, diarrhea, nausea and rectal pain.  ?Endocrine: Negative for hot flashes.  ?Genitourinary:  Negative for bladder incontinence, difficulty urinating, dysuria, frequency, hematuria and nocturia.   ?Musculoskeletal:  Negative for arthralgias, back pain, flank pain and myalgias.  ?Skin:  Negative for itching and rash.  ?Neurological:  Negative for dizziness, headaches, light-headedness and seizures.  ?     Left lower extremity weakness  ?Hematological:  Negative  for adenopathy. Does not bruise/bleed easily.  ?Psychiatric/Behavioral:  Negative for confusion and decreased concentration. The patient is not nervous/anxious.   ? ?MEDICAL HISTORY:  ?Past Medical History:  ?Diagnosis Date  ? Allergy   ? Seasonal  ? Asthma   ? as a child  ? Elevated PSA   ? History of kidney stones   ? Hyperlipidemia   ? Kidney stone   ? Osteoarthritis   ? Left Knee  ? ? ?SURGICAL HISTORY: ?Past Surgical History:  ?Procedure Laterality Date  ? EXTRACORPOREAL SHOCK WAVE LITHOTRIPSY  2001  ? EXTRACORPOREAL SHOCK WAVE LITHOTRIPSY Right 05/04/2018  ? Procedure: EXTRACORPOREAL SHOCK WAVE LITHOTRIPSY (ESWL);  Surgeon: Abbie Sons, MD;  Location: [facility] ORS;  Service: Urology;  Laterality: Right;  ? HERNIA REPAIR  1994  ? Right Inguinal- [facility]  ? KNEE ARTHROPLASTY Left 07/13/2021  ? Procedure: COMPUTER ASSISTED TOTAL KNEE ARTHROPLASTY;  Surgeon: Dereck Leep, MD;  Location: [facility] ORS;  Service: Orthopedics;  Laterality: Left;  ? ? ?SOCIAL HISTORY: ?Social History  ? ?Socioeconomic History  ? Marital status: Married  ?  Spouse name: Not on file  ? Number of children: Not on file  ? Years of education: Not on file  ? Highest education level: Not on file  ?Occupational History  ? Not on file  ?Tobacco Use  ? Smoking status: Never  ? Smokeless tobacco: Never  ?Vaping Use  ? Vaping Use: Never used  ?Substance and Sexual Activity  ? Alcohol use: Not Currently  ?  Comment: Occasional  ? Drug use: No  ? Sexual activity: Yes  ?Other Topics Concern  ? Not on file  ?Social History  Narrative  ? Not on file  ? ?Social Determinants of Health  ? ?Financial Resource Strain: Not on file  ?Food Insecurity: Not on file  ?Transportation Needs: Not on file  ?Physical Activity: Not on file  ?Stress: Not on file  ?Social Connections: Not on file  ?Intimate Partner Violence: Not on file  ? ? ?FAMILY HISTORY: ?Family History  ?Problem Relation Age of Onset  ? Healthy Father   ? Heart disease Mother   ?  Healthy Sister   ? Healthy Sister   ? Heart attack Brother   ? Prostate cancer Neg Hx   ? Kidney cancer Neg Hx   ? Bladder Cancer Neg Hx   ? Kidney disease Neg Hx   ? ? ?ALLERGIES:  has No Known Allergies. ? ?MEDICATIONS:  ?Current Outpatient Medications  ?Medication Sig Dispense Refill  ? acetaminophen (TYLENOL) 325 MG tablet Take 650 mg by mouth every 6 (six) hours as needed.    ? Multiple Vitamin (MULTI-VITAMINS) TABS Take 1 tablet by mouth daily.     ? tadalafil (CIALIS) 5 MG tablet Take 1 tablet (5 mg total) by mouth daily as needed for erectile dysfunction. 90 tablet 3  ? ?No current facility-administered medications for this visit.  ? ? ? ?PHYSICAL EXAMINATION: ?ECOG PERFORMANCE STATUS: 1 - Symptomatic but completely ambulatory ?Vitals:  ? 12/18/21 0905  ?BP: (!) 151/81  ?Pulse: 66  ?Resp: 18  ?Temp: 98.3 ?F (36.8 ?C)  ? ?Filed Weights  ? 12/18/21 0905  ?Weight: 194 lb 1.6 oz (88 kg)  ? ? ?Physical Exam ?Constitutional:   ?   General: He is not in acute distress. ?HENT:  ?   Head: Normocephalic and atraumatic.  ?Eyes:  ?   General: No scleral icterus. ?Cardiovascular:  ?   Rate and Rhythm: Normal rate and regular rhythm.  ?   Heart sounds: Normal heart sounds.  ?Pulmonary:  ?   Effort: Pulmonary effort is normal. No respiratory distress.  ?   Breath sounds: No wheezing.  ?Abdominal:  ?   General: Bowel sounds are normal. There is no distension.  ?   Palpations: Abdomen is soft.  ?Musculoskeletal:     ?   General: Normal range of motion.  ?   Cervical back: Normal range of motion and neck supple.  ?Skin: ?   General: Skin is warm and dry.  ?   Findings: No erythema or rash.  ?Neurological:  ?   Mental Status: He is alert and oriented to person, place, and time. Mental status is at baseline.  ?   Cranial Nerves: No cranial nerve deficit.  ?   Coordination: Coordination normal.  ?Psychiatric:     ?   Mood and Affect: Mood normal.  ? ? ?LABORATORY DATA:  ?I have reviewed the data as listed ?Lab Results   ?Component Value Date  ? WBC 5.2 12/18/2021  ? HGB 13.5 12/18/2021  ? HCT 40.9 12/18/2021  ? MCV 86.8 12/18/2021  ? PLT 230 12/18/2021  ? ?Recent Labs  ?  07/07/21 ?1210 12/18/21 ?0949  ?NA 139 136  ?K 3.9 3.9  ?CL 108 106  ?CO2 24 25  ?GLUCOSE 111* 115*  ?BUN 14 15  ?CREATININE 1.07 1.07  ?CALCIUM 8.9 8.9  ?GFRNONAA >60 >60  ?PROT 7.6 7.1  ?ALBUMIN 3.9 3.9  ?AST 18 16  ?ALT 22 16  ?ALKPHOS 49 57  ?BILITOT 1.1 0.9  ? ?Iron/TIBC/Ferritin/ %Sat ?No results found for: IRON, TIBC, FERRITIN, IRONPCTSAT  ? ? ?  RADIOGRAPHIC STUDIES: ?I have personally reviewed the radiological images as listed and agreed with the findings in the report. ?No results found. ? ? ? 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 Paresthesia of left foot   ? ?Previous labs were reviewed and discussed. IgG M protein 0.5, lamda restricted.  MGUS. ?I discussed with patient about the diagnosis of IgG MGUS which is an asymptomatic condition which has a small risk of progression to smoldering multiple myeloma and to symptomatic multiple myeloma. Less frequently, these patients progress to AL amyloidosis, light chain deposition disease, or another lymphoproliferative disorder. ?Will obtain further testing.  ?Check cbc cmp, multiple myeloma panel, light chain ratio, 24-hour urine protein electrophoresis.  LDH, beta-2 microglobulin level.,  NT-proBNP. ? ?Left foot paresthesia and weakness, likely secondary to his left knee total replacement.  Follow-up with neurology. ? ?Orders Placed This Encounter  ?Procedures  ? Technologist smear review  ?  Standing Status:   Future  ?  Number of Occurrences:   1  ?  Standing Expiration Date:   12/19/2022  ? Comprehensive metabolic panel  ?  Standing Status:   Future  ?  Number of Occurrences:   1  ?  Standing Expiration Date:   12/19/2022  ? CBC with Differential/Platelet  ?  Standing Status:   Future  ?  Number of Occurrences:   1  ?  Standing Expiration Date:   12/19/2022  ?  Multiple Myeloma Panel (SPEP&IFE w/QIG)  ?  Standing Status:   Future  ?  Number of Occurrences:   1  ?  Standing Expiration Date:   12/19/2022  ? Kappa/lambda light chains  ?  Standing Status:   Future  ?  Number of

## 2021-12-19 LAB — BETA 2 MICROGLOBULIN, SERUM: Beta-2 Microglobulin: 1.4 mg/L (ref 0.6–2.4)

## 2021-12-21 DIAGNOSIS — D472 Monoclonal gammopathy: Secondary | ICD-10-CM | POA: Diagnosis present

## 2021-12-21 DIAGNOSIS — Z96652 Presence of left artificial knee joint: Secondary | ICD-10-CM | POA: Insufficient documentation

## 2021-12-21 DIAGNOSIS — R202 Paresthesia of skin: Secondary | ICD-10-CM | POA: Diagnosis not present

## 2021-12-21 DIAGNOSIS — R531 Weakness: Secondary | ICD-10-CM | POA: Insufficient documentation

## 2021-12-21 LAB — KAPPA/LAMBDA LIGHT CHAINS
Kappa free light chain: 20 mg/L — ABNORMAL HIGH (ref 3.3–19.4)
Kappa, lambda light chain ratio: 1.72 — ABNORMAL HIGH (ref 0.26–1.65)
Lambda free light chains: 11.6 mg/L (ref 5.7–26.3)

## 2021-12-22 ENCOUNTER — Other Ambulatory Visit: Payer: Self-pay

## 2021-12-22 DIAGNOSIS — D472 Monoclonal gammopathy: Secondary | ICD-10-CM

## 2021-12-23 LAB — MULTIPLE MYELOMA PANEL, SERUM
Albumin SerPl Elph-Mcnc: 3.7 g/dL (ref 2.9–4.4)
Albumin/Glob SerPl: 1.2 (ref 0.7–1.7)
Alpha 1: 0.2 g/dL (ref 0.0–0.4)
Alpha2 Glob SerPl Elph-Mcnc: 0.7 g/dL (ref 0.4–1.0)
B-Globulin SerPl Elph-Mcnc: 1 g/dL (ref 0.7–1.3)
Gamma Glob SerPl Elph-Mcnc: 1.4 g/dL (ref 0.4–1.8)
Globulin, Total: 3.3 g/dL (ref 2.2–3.9)
IgA: 185 mg/dL (ref 61–437)
IgG (Immunoglobin G), Serum: 1517 mg/dL (ref 603–1613)
IgM (Immunoglobulin M), Srm: 22 mg/dL (ref 20–172)
M Protein SerPl Elph-Mcnc: 0.6 g/dL — ABNORMAL HIGH
Total Protein ELP: 7 g/dL (ref 6.0–8.5)

## 2021-12-25 LAB — IFE+PROTEIN ELECTRO, 24-HR UR
% BETA, Urine: 0 %
ALPHA 1 URINE: 0 %
Albumin, U: 100 %
Alpha 2, Urine: 0 %
GAMMA GLOBULIN URINE: 0 %
Total Protein, Urine-Ur/day: 100 mg/24 hr (ref 30–150)
Total Protein, Urine: 5.7 mg/dL
Total Volume: 1750

## 2022-01-14 ENCOUNTER — Encounter: Payer: Self-pay | Admitting: Oncology

## 2022-01-14 ENCOUNTER — Inpatient Hospital Stay: Payer: Medicare HMO | Attending: Oncology | Admitting: Oncology

## 2022-01-14 VITALS — BP 146/75 | HR 66 | Temp 97.3°F | Resp 18 | Wt 195.7 lb

## 2022-01-14 DIAGNOSIS — Z96652 Presence of left artificial knee joint: Secondary | ICD-10-CM | POA: Diagnosis not present

## 2022-01-14 DIAGNOSIS — R202 Paresthesia of skin: Secondary | ICD-10-CM | POA: Diagnosis not present

## 2022-01-14 DIAGNOSIS — R531 Weakness: Secondary | ICD-10-CM | POA: Diagnosis not present

## 2022-01-14 DIAGNOSIS — D472 Monoclonal gammopathy: Secondary | ICD-10-CM | POA: Insufficient documentation

## 2022-01-14 NOTE — Progress Notes (Signed)
Patient here for follow up. No new concerns voiced.  °

## 2022-01-14 NOTE — Progress Notes (Signed)
?Hematology/Oncology Consult note ?[phone] [phone] ?  ? ?   ? ? ?Patient Care Team: ?Adin Hector, MD as PCP - General (Internal Medicine) ? ?REFERRING PROVIDER: ?Adin Hector, MD  ?CHIEF COMPLAINTS/REASON FOR VISIT:  ?MGUS  ? ?HISTORY OF PRESENTING ILLNESS:  ? ?Shane Abbott is a  71 y.o.  male with PMH listed below was seen in consultation at the request of  Adin Hector, MD  for evaluation of abnormal SPEP ? ?Patient reports starting to experience left foot weakness, sensation changes after his left total knee replacement in October 2022.   ?Patient was seen by neurology Dr. Manuella Ghazi recently for work-up.  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  Patient was referred to hematology for further evaluation. ? ?Otherwise patient reports feeling well at baseline.  Denies unintentional weight loss, night sweats, fever. ? ?INTERVAL HISTORY ?Shane Abbott is a 71 y.o. male who has above history reviewed by me today presents for follow up visit to review results ?Marland Kitchen  Patient has no new complaints. ? ? ?Review of Systems  ?Constitutional:  Negative for appetite change, chills, diaphoresis, fatigue, fever and unexpected weight change.  ?HENT:   Negative for hearing loss, lump/mass, nosebleeds, sore throat and voice change.   ?Eyes:  Negative for eye problems and icterus.  ?Respiratory:  Negative for chest tightness, cough, hemoptysis, shortness of breath and wheezing.   ?Cardiovascular:  Negative for chest pain and leg swelling.  ?Gastrointestinal:  Negative for abdominal distention, abdominal pain, blood in stool, diarrhea, nausea and rectal pain.  ?Endocrine: Negative for hot flashes.  ?Genitourinary:  Negative for bladder incontinence, difficulty urinating, dysuria, frequency, hematuria and nocturia.   ?Musculoskeletal:  Negative for arthralgias, back pain, flank pain and myalgias.  ?Skin:  Negative  for itching and rash.  ?Neurological:  Negative for dizziness, headaches, light-headedness and seizures.  ?     Left lower extremity weakness  ?Hematological:  Negative for adenopathy. Does not bruise/bleed easily.  ?Psychiatric/Behavioral:  Negative for confusion and decreased concentration. The patient is not nervous/anxious.   ? ?MEDICAL HISTORY:  ?Past Medical History:  ?Diagnosis Date  ? Allergy   ? Seasonal  ? Asthma   ? as a child  ? Elevated PSA   ? History of kidney stones   ? Hyperlipidemia   ? Kidney stone   ? Osteoarthritis   ? Left Knee  ? ? ?SURGICAL HISTORY: ?Past Surgical History:  ?Procedure Laterality Date  ? EXTRACORPOREAL SHOCK WAVE LITHOTRIPSY  2001  ? EXTRACORPOREAL SHOCK WAVE LITHOTRIPSY Right 05/04/2018  ? Procedure: EXTRACORPOREAL SHOCK WAVE LITHOTRIPSY (ESWL);  Surgeon: Abbie Sons, MD;  Location: [facility] ORS;  Service: Urology;  Laterality: Right;  ? HERNIA REPAIR  1994  ? Right [facility]  ? KNEE ARTHROPLASTY Left 07/13/2021  ? Procedure: COMPUTER ASSISTED TOTAL KNEE ARTHROPLASTY;  Surgeon: Dereck Leep, MD;  Location: [facility] ORS;  Service: Orthopedics;  Laterality: Left;  ? ? ?SOCIAL HISTORY: ?Social History  ? ?Socioeconomic History  ? Marital status: Married  ?  Spouse name: Not on file  ? Number of children: Not on file  ? Years of education: Not on file  ? Highest education level: Not on file  ?Occupational History  ? Not on file  ?Tobacco Use  ? Smoking status: Never  ? Smokeless tobacco: Never  ?Vaping Use  ? Vaping Use: Never used  ?Substance and Sexual  Activity  ? Alcohol use: Not Currently  ?  Comment: Occasional  ? Drug use: No  ? Sexual activity: Yes  ?Other Topics Concern  ? Not on file  ?Social History Narrative  ? Not on file  ? ?Social Determinants of Health  ? ?Financial Resource Strain: Not on file  ?Food Insecurity: Not on file  ?Transportation Needs: Not on file  ?Physical Activity: Not on file  ?Stress: Not on file  ?Social Connections: Not  on file  ?Intimate Partner Violence: Not on file  ? ? ?FAMILY HISTORY: ?Family History  ?Problem Relation Age of Onset  ? Healthy Father   ? Heart disease Mother   ? Healthy Sister   ? Healthy Sister   ? Heart attack Brother   ? Prostate cancer Neg Hx   ? Kidney cancer Neg Hx   ? Bladder Cancer Neg Hx   ? Kidney disease Neg Hx   ? ? ?ALLERGIES:  has No Known Allergies. ? ?MEDICATIONS:  ?Current Outpatient Medications  ?Medication Sig Dispense Refill  ? acetaminophen (TYLENOL) 325 MG tablet Take 650 mg by mouth every 6 (six) hours as needed.    ? ALPHA LIPOIC ACID PO Take 600 mg by mouth daily.    ? Multiple Vitamin (MULTI-VITAMINS) TABS Take 1 tablet by mouth daily.     ? tadalafil (CIALIS) 5 MG tablet Take 1 tablet (5 mg total) by mouth daily as needed for erectile dysfunction. 90 tablet 3  ? ?No current facility-administered medications for this visit.  ? ? ? ?PHYSICAL EXAMINATION: ?ECOG PERFORMANCE STATUS: 1 - Symptomatic but completely ambulatory ?Vitals:  ? 01/14/22 1008  ?BP: (!) 146/75  ?Pulse: 66  ?Resp: 18  ?Temp: (!) 97.3 ?F (36.3 ?C)  ? ?Filed Weights  ? 01/14/22 1008  ?Weight: 195 lb 11.2 oz (88.8 kg)  ? ? ?Physical Exam ?Constitutional:   ?   General: He is not in acute distress. ?HENT:  ?   Head: Normocephalic and atraumatic.  ?Eyes:  ?   General: No scleral icterus. ?Cardiovascular:  ?   Rate and Rhythm: Normal rate and regular rhythm.  ?   Heart sounds: Normal heart sounds.  ?Pulmonary:  ?   Effort: Pulmonary effort is normal. No respiratory distress.  ?   Breath sounds: No wheezing.  ?Abdominal:  ?   General: Bowel sounds are normal. There is no distension.  ?   Palpations: Abdomen is soft.  ?Musculoskeletal:     ?   General: Normal range of motion.  ?   Cervical back: Normal range of motion and neck supple.  ?Skin: ?   General: Skin is warm and dry.  ?   Findings: No erythema or rash.  ?Neurological:  ?   Mental Status: He is alert and oriented to person, place, and time. Mental status is at  baseline.  ?   Cranial Nerves: No cranial nerve deficit.  ?   Coordination: Coordination normal.  ?Psychiatric:     ?   Mood and Affect: Mood normal.  ? ? ?LABORATORY DATA:  ?I have reviewed the data as listed ?Lab Results  ?Component Value Date  ? WBC 5.2 12/18/2021  ? HGB 13.5 12/18/2021  ? HCT 40.9 12/18/2021  ? MCV 86.8 12/18/2021  ? PLT 230 12/18/2021  ? ?Recent Labs  ?  07/07/21 ?1210 12/18/21 ?0949  ?NA 139 136  ?K 3.9 3.9  ?CL 108 106  ?CO2 24 25  ?GLUCOSE 111* 115*  ?BUN 14 15  ?  CREATININE 1.07 1.07  ?CALCIUM 8.9 8.9  ?GFRNONAA >60 >60  ?PROT 7.6 7.1  ?ALBUMIN 3.9 3.9  ?AST 18 16  ?ALT 22 16  ?ALKPHOS 49 57  ?BILITOT 1.1 0.9  ? ? ?Iron/TIBC/Ferritin/ %Sat ?No results found for: IRON, TIBC, FERRITIN, IRONPCTSAT  ? ? ?RADIOGRAPHIC STUDIES: ?I have personally reviewed the radiological images as listed and agreed with the findings in the report. ?No results found. ? ? ? 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 Paresthesia of left foot   ? 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 ?Diagnosis of MGUS was reviewed and discussed with patient. ?Recommend observation and check labs every 6 months. ?Check cbc cmp, multiple myeloma panel, light chain ratio, NT-proBNP.  In 6 months. ? ?Left foot paresthesia and weakness, likely secondary to his left knee total replacement.  Follow-up with neurology. ? ?Orders Placed This Encounter  ?Procedures  ? CBC with Differential/Platelet  ?  Standing Status:   Future  ?  Standing Expiration Date:   01/15/2023  ? Comprehensive metabolic panel  ?  Standing Status:   Future  ?  Standing Expiration Date:   01/14/2023  ? Kappa/lambda light chains  ?  Standing Status:   Future  ?  Standing Expiration Date:   01/14/2023  ? Multiple Myeloma Panel (SPEP&IFE w/QIG)  ?  Standing Status:   Future  ?  Standing Expiration Date:   01/14/2023  ?  ?All questions were answered. The patient  knows to call the clinic with any problems questions or concerns. ? ?cc ?Adin Hector, MD  ? ? ?Return of visit: 3 to 4 weeks to go over results. ?Thank you for this kind referral and the opportunity to p

## 2022-07-19 ENCOUNTER — Other Ambulatory Visit: Payer: Medicare HMO

## 2022-07-26 ENCOUNTER — Ambulatory Visit: Payer: Medicare HMO | Admitting: Oncology

## 2022-08-04 ENCOUNTER — Other Ambulatory Visit: Payer: Medicare HMO

## 2022-08-11 ENCOUNTER — Ambulatory Visit: Payer: Medicare HMO | Admitting: Oncology

## 2022-10-13 ENCOUNTER — Ambulatory Visit: Payer: Medicare HMO | Admitting: Urology

## 2022-10-29 ENCOUNTER — Other Ambulatory Visit: Payer: Self-pay | Admitting: *Deleted

## 2022-10-29 DIAGNOSIS — R972 Elevated prostate specific antigen [PSA]: Secondary | ICD-10-CM

## 2022-11-01 ENCOUNTER — Other Ambulatory Visit: Payer: Medicare HMO

## 2022-11-01 DIAGNOSIS — R972 Elevated prostate specific antigen [PSA]: Secondary | ICD-10-CM

## 2022-11-02 LAB — PSA TOTAL (REFLEX TO FREE): Prostate Specific Ag, Serum: 4 ng/mL (ref 0.0–4.0)

## 2022-11-02 LAB — FPSA% REFLEX
% FREE PSA: 17.8 %
PSA, FREE: 0.71 ng/mL

## 2022-11-03 ENCOUNTER — Ambulatory Visit: Payer: Medicare HMO | Admitting: Urology

## 2022-11-03 VITALS — BP 143/84 | HR 80 | Ht 71.0 in | Wt 186.0 lb

## 2022-11-03 DIAGNOSIS — Z125 Encounter for screening for malignant neoplasm of prostate: Secondary | ICD-10-CM

## 2022-11-03 NOTE — Patient Instructions (Signed)

## 2022-11-03 NOTE — Progress Notes (Signed)
   11/03/2022 3:55 PM   Shane Abbott December 03, 1950 [id number]  Reason for visit: Follow up PSA screening  HPI: Very healthy 72 year old male with no family history of prostate cancer who we have followed for PSA screening. Baseline PSA has ranged from 3.5-4, and DRE has been benign.  He had an isolated elevated value of 6.2 in January 2022, but this dropped back down to baseline of 3.82 weeks later on repeat testing.  PSA this year is stable at 4.0 from 3.9 last year.  We reviewed the risks and benefits of PSA screening, as well as the AUA guidelines.    At our visit in January 2023 he had mentioned some concerns about erections and was interested in a trial of Cialis.  A prescription was sent but he actually never ended up using or needing this medication and denies any problems with ED today.  RTC 1 year PSA reflex to free prior   Billey Co, MD  [facility] [address] [place], [place] [address] [phone]

## 2022-11-12 ENCOUNTER — Encounter: Payer: Self-pay | Admitting: Gastroenterology

## 2022-11-15 ENCOUNTER — Encounter: Payer: Self-pay | Admitting: Gastroenterology

## 2022-11-15 ENCOUNTER — Other Ambulatory Visit: Payer: Self-pay

## 2022-11-15 ENCOUNTER — Ambulatory Visit: Payer: Medicare HMO | Admitting: Certified Registered"

## 2022-11-15 ENCOUNTER — Ambulatory Visit
Admission: RE | Admit: 2022-11-15 | Discharge: 2022-11-15 | Disposition: A | Discharge status: Home / Self Care | Payer: Medicare HMO | Attending: Gastroenterology | Admitting: Gastroenterology

## 2022-11-15 ENCOUNTER — Encounter
Admission: RE | Disposition: A | Discharge status: Home / Self Care | Payer: Self-pay | Source: Home / Self Care | Attending: Gastroenterology

## 2022-11-15 DIAGNOSIS — Z8546 Personal history of malignant neoplasm of prostate: Secondary | ICD-10-CM | POA: Diagnosis not present

## 2022-11-15 DIAGNOSIS — Z1211 Encounter for screening for malignant neoplasm of colon: Secondary | ICD-10-CM | POA: Insufficient documentation

## 2022-11-15 DIAGNOSIS — R7303 Prediabetes: Secondary | ICD-10-CM | POA: Insufficient documentation

## 2022-11-15 DIAGNOSIS — Z8709 Personal history of other diseases of the respiratory system: Secondary | ICD-10-CM | POA: Diagnosis not present

## 2022-11-15 DIAGNOSIS — Z96653 Presence of artificial knee joint, bilateral: Secondary | ICD-10-CM | POA: Insufficient documentation

## 2022-11-15 DIAGNOSIS — D472 Monoclonal gammopathy: Secondary | ICD-10-CM | POA: Insufficient documentation

## 2022-11-15 DIAGNOSIS — K640 First degree hemorrhoids: Secondary | ICD-10-CM | POA: Insufficient documentation

## 2022-11-15 DIAGNOSIS — D123 Benign neoplasm of transverse colon: Secondary | ICD-10-CM | POA: Diagnosis not present

## 2022-11-15 HISTORY — PX: COLONOSCOPY WITH PROPOFOL: SHX5780

## 2022-11-15 SURGERY — COLONOSCOPY WITH PROPOFOL
Anesthesia: General

## 2022-11-15 MED ORDER — PROPOFOL 10 MG/ML IV BOLUS
INTRAVENOUS | Status: AC
  Filled 2022-11-15: qty 40

## 2022-11-15 MED ORDER — PROPOFOL 500 MG/50ML IV EMUL
INTRAVENOUS | Status: DC | PRN
  Administered 2022-11-15: 100 mg via INTRAVENOUS
  Administered 2022-11-15: 130 ug/kg/min via INTRAVENOUS

## 2022-11-15 MED ORDER — LIDOCAINE HCL (PF) 2 % IJ SOLN
INTRAMUSCULAR | Status: AC
  Filled 2022-11-15: qty 5

## 2022-11-15 MED ORDER — SODIUM CHLORIDE 0.9 % IV SOLN: INTRAVENOUS | Status: DC

## 2022-11-15 MED ORDER — LIDOCAINE HCL (CARDIAC) PF 100 MG/5ML IV SOSY
PREFILLED_SYRINGE | INTRAVENOUS | Status: DC | PRN
  Administered 2022-11-15: 100 mg via INTRAVENOUS

## 2022-11-15 NOTE — Interval H&P Note (Signed)
History and Physical Interval Note: Preprocedure H&P from 11/15/22  was reviewed and there was no interval change after seeing and examining the patient.  Written consent was obtained from the patient after discussion of risks, benefits, and alternatives. Patient has consented to proceed with Colonoscopy with possible intervention   11/15/2022 11:55 AM  Shane Abbott  has presented today for surgery, with the diagnosis of colon cancer screening.  The various methods of treatment have been discussed with the patient and family. After consideration of risks, benefits and other options for treatment, the patient has consented to  Procedure(s): COLONOSCOPY WITH PROPOFOL (N/A) as a surgical intervention.  The patient's history has been reviewed, patient examined, no change in status, stable for surgery.  I have reviewed the patient's chart and labs.  Questions were answered to the patient's satisfaction.     Annamaria Helling

## 2022-11-15 NOTE — Anesthesia Preprocedure Evaluation (Addendum)
Anesthesia Evaluation  Patient identified by MRN, date of birth, ID band Patient awake    Reviewed: Allergy & Precautions, NPO status , Patient's Chart, lab work & pertinent test results  History of Anesthesia Complications Negative for: history of anesthetic complications  Airway Mallampati: IV   Neck ROM: Full    Dental  (+) Partial Upper   Pulmonary asthma (as a child)    Pulmonary exam normal breath sounds clear to auscultation       Cardiovascular Exercise Tolerance: Good negative cardio ROS Normal cardiovascular exam Rhythm:Regular Rate:Normal     Neuro/Psych negative neurological ROS     GI/Hepatic negative GI ROS,,,  Endo/Other  Prediabetes   Renal/GU Renal disease (nephrolithiasis)   Prostate CA    Musculoskeletal  (+) Arthritis ,    Abdominal   Peds  Hematology negative hematology ROS (+)   Anesthesia Other Findings   Reproductive/Obstetrics                             Anesthesia Physical Anesthesia Plan  ASA: 2  Anesthesia Plan: General   Post-op Pain Management:    Induction: Intravenous  PONV Risk Score and Plan: 2 and Propofol infusion, TIVA and Treatment may vary due to age or medical condition  Airway Management Planned: Natural Airway  Additional Equipment:   Intra-op Plan:   Post-operative Plan:   Informed Consent: I have reviewed the patients History and Physical, chart, labs and discussed the procedure including the risks, benefits and alternatives for the proposed anesthesia with the patient or authorized representative who has indicated his/her understanding and acceptance.       Plan Discussed with: CRNA  Anesthesia Plan Comments: (LMA/GETA backup discussed.  Patient consented for risks of anesthesia including but not limited to:  - adverse reactions to medications - damage to eyes, teeth, lips or other oral mucosa - nerve damage due to  positioning  - sore throat or hoarseness - damage to heart, brain, nerves, lungs, other parts of body or loss of life  Informed patient about role of CRNA in peri- and intra-operative care.  Patient voiced understanding.)        Anesthesia Quick Evaluation

## 2022-11-15 NOTE — Anesthesia Postprocedure Evaluation (Signed)
Anesthesia Post Note  Patient: Shane Abbott  Procedure(s) Performed: COLONOSCOPY WITH PROPOFOL  Patient location during evaluation: PACU Anesthesia Type: General Level of consciousness: awake and alert, oriented and patient cooperative Pain management: pain level controlled Vital Signs Assessment: post-procedure vital signs reviewed and stable Respiratory status: spontaneous breathing, nonlabored ventilation and respiratory function stable Cardiovascular status: blood pressure returned to baseline and stable Postop Assessment: adequate PO intake Anesthetic complications: no   No notable events documented.   Last Vitals:  Vitals:   11/15/22 1242 11/15/22 1252  BP: (!) 108/97 (!) 148/91  Pulse: 73 76  Resp: (!) 21 15  Temp:    SpO2: 98% 99%    Last Pain:  Vitals:   11/15/22 1252  TempSrc:   PainSc: 0-No pain                 Darrin Nipper

## 2022-11-15 NOTE — Transfer of Care (Signed)
Immediate Anesthesia Transfer of Care Note  Patient: Shane Abbott  Procedure(s) Performed: COLONOSCOPY WITH PROPOFOL  Patient Location: PACU  Anesthesia Type:General  Level of Consciousness: drowsy  Airway & Oxygen Therapy: Patient Spontanous Breathing  Post-op Assessment: Report given to RN and Post -op Vital signs reviewed and stable  Post vital signs: Reviewed and stable  Last Vitals:  Vitals Value Taken Time  BP 116/83 11/15/22 1233  Temp 35.9 1233  Pulse 93 11/15/22 1233  Resp 14 11/15/22 1233  SpO2 97 % 11/15/22 1233  Vitals shown include unvalidated device data.  Last Pain:  Vitals:   11/15/22 1142  TempSrc: Temporal  PainSc: 0-No pain         Complications: No notable events documented.

## 2022-11-15 NOTE — Op Note (Signed)
[facility] Gastroenterology Patient Name: Shane Abbott Procedure Date: 11/15/2022 11:36 AM MRN: [id number] Account #: [id number] Date of Birth: May 31, 1951 Admit Type: Outpatient Age: 72 Room: [facility] ENDO ROOM 2 Gender: Male Note Status: Finalized Instrument Name: Colonoscope [id number] Procedure:             Colonoscopy Indications:           Screening for colorectal malignant neoplasm Providers:             Rueben Bash, DO Referring MD:          Ramonita Lab, MD (Referring MD) Medicines:             Monitored Anesthesia Care Complications:         No immediate complications. Estimated blood loss:                         Minimal. Procedure:             Pre-Anesthesia Assessment:                        - Prior to the procedure, a History and Physical was                         performed, and patient medications and allergies were                         reviewed. The patient is competent. The risks and                         benefits of the procedure and the sedation options and                         risks were discussed with the patient. All questions                         were answered and informed consent was obtained.                         Patient identification and proposed procedure were                         verified by the physician, the nurse, the anesthetist                         and the technician in the endoscopy suite. Mental                         Status Examination: alert and oriented. Airway                         Examination: normal oropharyngeal airway and neck                         mobility. Respiratory Examination: clear to                         auscultation. CV Examination: RRR, no murmurs, no S3  or S4. Prophylactic Antibiotics: The patient does not                         require prophylactic antibiotics. Prior                         Anticoagulants: The patient has taken no anticoagulant                          or antiplatelet agents. ASA Grade Assessment: II - A                         patient with mild systemic disease. After reviewing                         the risks and benefits, the patient was deemed in                         satisfactory condition to undergo the procedure. The                         anesthesia plan was to use monitored anesthesia care                         (MAC). Immediately prior to administration of                         medications, the patient was re-assessed for adequacy                         to receive sedatives. The heart rate, respiratory                         rate, oxygen saturations, blood pressure, adequacy of                         pulmonary ventilation, and response to care were                         monitored throughout the procedure. The physical                         status of the patient was re-assessed after the                         procedure.                        After obtaining informed consent, the colonoscope was                         passed under direct vision. Throughout the procedure,                         the patient's blood pressure, pulse, and oxygen                         saturations were monitored continuously. The  Colonoscope was introduced through the anus and                         advanced to the the terminal ileum, with                         identification of the appendiceal orifice and IC                         valve. The colonoscopy was performed without                         difficulty. The patient tolerated the procedure well.                         The quality of the bowel preparation was evaluated                         using the BBPS [facility] Bowel Preparation Scale) with                         scores of: Right Colon = 3, Transverse Colon = 3 and                         Left Colon = 3 (entire mucosa seen well with no                         residual  staining, small fragments of stool or opaque                         liquid). The total BBPS score equals 9. The terminal                         ileum, ileocecal valve, appendiceal orifice, and                         rectum were photographed. Findings:      The perianal and digital rectal examinations were normal. Pertinent       negatives include normal sphincter tone.      The terminal ileum appeared normal. Estimated blood loss: none.      Two sessile polyps were found in the transverse colon. The polyps were 1       to 2 mm in size. These polyps were removed with a jumbo cold forceps.       Resection and retrieval were complete. Estimated blood loss was minimal.      Non-bleeding internal hemorrhoids were found during retroflexion. The       hemorrhoids were Grade I (internal hemorrhoids that do not prolapse).       Estimated blood loss: none.      The exam was otherwise without abnormality on direct and retroflexion       views. Impression:            - The examined portion of the ileum was normal.                        - Two 1 to 2 mm polyps in the transverse colon,  removed with a jumbo cold forceps. Resected and                         retrieved.                        - Non-bleeding internal hemorrhoids.                        - The examination was otherwise normal on direct and                         retroflexion views. Recommendation:        - Patient has a contact number available for                         emergencies. The signs and symptoms of potential                         delayed complications were discussed with the patient.                         Return to normal activities tomorrow. Written                         discharge instructions were provided to the patient.                        - Discharge patient to home.                        - Resume previous diet.                        - Continue present medications.                         - Await pathology results.                        - Repeat colonoscopy for surveillance based on                         pathology results.                        - Return to referring physician as previously                         scheduled.                        - The findings and recommendations were discussed with                         the patient. Procedure Code(s):     --- Professional ---                        [phone], Colonoscopy, flexible; with biopsy, single or                         multiple Diagnosis  Code(s):     --- Professional ---                        Z12.11, Encounter for screening for malignant neoplasm                         of colon                        K64.0, First degree hemorrhoids                        D12.3, Benign neoplasm of transverse colon (hepatic                         flexure or splenic flexure) CPT copyright 2022 American Medical Association. All rights reserved. The codes documented in this report are preliminary and upon coder review may  be revised to meet current compliance requirements. Attending Participation:      I personally performed the entire procedure. Volney American, DO Annamaria Helling DO, DO 11/15/2022 12:33:27 PM This report has been signed electronically. Number of Addenda: 0 Note Initiated On: 11/15/2022 11:36 AM Scope Withdrawal Time: 0 hours 14 minutes 34 seconds  Total Procedure Duration: 0 hours 20 minutes 19 seconds  Estimated Blood Loss:  Estimated blood loss was minimal.      [facility]

## 2022-11-15 NOTE — H&P (Signed)
Pre-Procedure H&P   Patient ID: Shane Abbott is a 72 y.o. male.  Gastroenterology Provider: Annamaria Helling, DO  Referring Provider: Dr. Caryl Comes PCP: Adin Hector, MD  Date: 11/15/2022  HPI Shane Abbott is a 72 y.o. male who presents today for Colonoscopy for Screening colonoscopy .  Patient last underwent colonoscopy in February 2013 which was normal.  No family history colon cancer or colon polyps  Daily bowel movement without melena hematochezia diarrhea or constipation.  A1c 6.0 creatinine 1.1 hemoglobin 13.2 MCV 89.6 platelets 101,000  History of bilateral knee replacement and MGUS   Past Medical History:  Diagnosis Date   Allergy    Seasonal   Asthma    as a child   Elevated PSA    History of kidney stones    Hyperlipidemia    Osteoarthritis    Left Knee    Past Surgical History:  Procedure Laterality Date   EXTRACORPOREAL SHOCK WAVE LITHOTRIPSY  2001   EXTRACORPOREAL SHOCK WAVE LITHOTRIPSY Right 05/04/2018   Procedure: EXTRACORPOREAL SHOCK WAVE LITHOTRIPSY (ESWL);  Surgeon: Abbie Sons, MD;  Location: [facility] OR[facility];  Service: Urology;  Laterality: Right;   HERNIA REPAIR  1994   Right Inguinal- [facility] Left 07/13/2021   Procedure: COMPUTER ASSISTED TOTAL KNEE ARTHROPLASTY;  Surgeon: Dereck Leep, MD;  Location: [facility];  Service: Orthopedics;  Laterality: Left;    Family History No h/o GI disease or malignancy  Review of Systems  Constitutional:  Negative for activity change, appetite change, chills, diaphoresis, fatigue, fever and unexpected weight change.  HENT:  Negative for trouble swallowing and voice change.   Respiratory:  Negative for shortness of breath and wheezing.   Cardiovascular:  Negative for chest pain, palpitations and leg swelling.  Gastrointestinal:  Negative for abdominal distention, abdominal pain, anal bleeding, blood in stool, constipation, diarrhea, nausea and  vomiting.  Musculoskeletal:  Negative for arthralgias and myalgias.  Skin:  Negative for color change and pallor.  Neurological:  Negative for dizziness, syncope and weakness.  Psychiatric/Behavioral:  Negative for confusion. The patient is not nervous/anxious.   All other systems reviewed and are negative.    Medications No current facility-administered medications on file prior to encounter.   Current Outpatient Medications on File Prior to Encounter  Medication Sig Dispense Refill   ALPHA LIPOIC ACID PO Take 600 mg by mouth daily.     Multiple Vitamin (MULTI-VITAMINS) TABS Take 1 tablet by mouth daily.      acetaminophen (TYLENOL) 325 MG tablet Take 650 mg by mouth every 6 (six) hours as needed.      Pertinent medications related to GI and procedure were reviewed by me with the patient prior to the procedure   Current Facility-Administered Medications:    0.9 %  sodium chloride infusion, , Intravenous, Continuous, Annamaria Helling, DO, Last Rate: 20 mL/hr at 11/15/22 1153, New Bag at 11/15/22 1153      No Known Allergies Allergies were reviewed by me prior to the procedure  Objective   Body mass index is 26.19 kg/m. Vitals:   11/15/22 1136 11/15/22 1142  BP:  (!) 135/95  Pulse:  84  Resp:  18  Temp:  (!) 97.1 F (36.2 C)  TempSrc:  Temporal  SpO2:  97%  Weight: 85.2 kg   Height: '5\' 11"'$  (1.803 m)      Physical Exam Vitals and nursing note reviewed.  Constitutional:  General: He is not in acute distress.    Appearance: Normal appearance. He is not ill-appearing, toxic-appearing or diaphoretic.  HENT:     Head: Normocephalic and atraumatic.     Nose: Nose normal.     Mouth/Throat:     Mouth: Mucous membranes are moist.     Pharynx: Oropharynx is clear.  Eyes:     General: No scleral icterus.    Extraocular Movements: Extraocular movements intact.  Cardiovascular:     Rate and Rhythm: Normal rate and regular rhythm.     Heart sounds: Normal heart  sounds. No murmur heard.    No friction rub. No gallop.  Pulmonary:     Effort: Pulmonary effort is normal. No respiratory distress.     Breath sounds: Normal breath sounds. No wheezing, rhonchi or rales.  Abdominal:     General: Bowel sounds are normal. There is no distension.     Palpations: Abdomen is soft.     Tenderness: There is no abdominal tenderness. There is no guarding or rebound.  Musculoskeletal:     Cervical back: Neck supple.     Right lower leg: No edema.     Left lower leg: No edema.  Skin:    General: Skin is warm and dry.     Coloration: Skin is not jaundiced or pale.  Neurological:     General: No focal deficit present.     Mental Status: He is alert and oriented to person, place, and time. Mental status is at baseline.  Psychiatric:        Mood and Affect: Mood normal.        Behavior: Behavior normal.        Thought Content: Thought content normal.        Judgment: Judgment normal.      Assessment:  Shane Abbott is a 72 y.o. male  who presents today for Colonoscopy for Screening colonoscopy .  Plan:  Colonoscopy with possible intervention today  Colonoscopy with possible biopsy, control of bleeding, polypectomy, and interventions as necessary has been discussed with the patient/patient representative. Informed consent was obtained from the patient/patient representative after explaining the indication, nature, and risks of the procedure including but not limited to death, bleeding, perforation, missed neoplasm/lesions, cardiorespiratory compromise, and reaction to medications. Opportunity for questions was given and appropriate answers were provided. Patient/patient representative has verbalized understanding is amenable to undergoing the procedure.   Annamaria Helling, DO  [facility]  Portions of the record may have been created with voice recognition software. Occasional wrong-word or 'sound-a-like' substitutions may  have occurred due to the inherent limitations of voice recognition software.  Read the chart carefully and recognize, using context, where substitutions may have occurred.

## 2022-11-16 ENCOUNTER — Encounter: Payer: Self-pay | Admitting: Gastroenterology

## 2022-11-16 LAB — SURGICAL PATHOLOGY

## 2022-12-21 ENCOUNTER — Other Ambulatory Visit: Payer: Self-pay | Admitting: Internal Medicine

## 2022-12-21 DIAGNOSIS — R7303 Prediabetes: Secondary | ICD-10-CM

## 2022-12-21 DIAGNOSIS — E7800 Pure hypercholesterolemia, unspecified: Secondary | ICD-10-CM

## 2023-01-05 ENCOUNTER — Ambulatory Visit
Admission: RE | Admit: 2023-01-05 | Discharge: 2023-01-05 | Disposition: A | Discharge status: Home / Self Care | Payer: Self-pay | Source: Ambulatory Visit | Attending: Internal Medicine | Admitting: Internal Medicine

## 2023-01-05 DIAGNOSIS — E7800 Pure hypercholesterolemia, unspecified: Secondary | ICD-10-CM | POA: Insufficient documentation

## 2023-01-05 DIAGNOSIS — R7303 Prediabetes: Secondary | ICD-10-CM | POA: Insufficient documentation

## 2023-10-27 ENCOUNTER — Other Ambulatory Visit: Payer: Self-pay

## 2023-10-27 DIAGNOSIS — R972 Elevated prostate specific antigen [PSA]: Secondary | ICD-10-CM

## 2023-10-27 DIAGNOSIS — Z125 Encounter for screening for malignant neoplasm of prostate: Secondary | ICD-10-CM

## 2023-10-31 ENCOUNTER — Other Ambulatory Visit: Payer: Self-pay

## 2023-11-02 ENCOUNTER — Ambulatory Visit: Payer: Medicare HMO | Admitting: Urology

## 2023-11-07 ENCOUNTER — Other Ambulatory Visit: Payer: Medicare HMO

## 2023-11-08 ENCOUNTER — Other Ambulatory Visit: Payer: Medicare HMO

## 2023-11-08 DIAGNOSIS — R972 Elevated prostate specific antigen [PSA]: Secondary | ICD-10-CM

## 2023-11-08 DIAGNOSIS — Z125 Encounter for screening for malignant neoplasm of prostate: Secondary | ICD-10-CM

## 2023-11-09 LAB — PSA: Prostate Specific Ag, Serum: 4.5 ng/mL — ABNORMAL HIGH (ref 0.0–4.0)

## 2023-11-10 ENCOUNTER — Ambulatory Visit: Payer: Self-pay | Admitting: Urology

## 2023-11-29 ENCOUNTER — Ambulatory Visit: Payer: Medicare HMO | Admitting: Urology

## 2023-11-29 ENCOUNTER — Encounter: Payer: Self-pay | Admitting: Urology

## 2023-11-29 VITALS — BP 148/82 | Ht 71.0 in | Wt 187.0 lb

## 2023-11-29 DIAGNOSIS — R972 Elevated prostate specific antigen [PSA]: Secondary | ICD-10-CM | POA: Diagnosis not present

## 2023-11-29 NOTE — Addendum Note (Signed)
 Addended by: Frankey Shown on: 11/29/2023 01:54 PM   Modules accepted: Orders

## 2023-11-29 NOTE — Progress Notes (Signed)
   11/29/2023 1:51 PM   Shane Abbott [birth year] [id number]  Reason for visit: Follow up PSA screening  HPI: Very healthy 73 year old male with no family history of prostate cancer who we have followed for PSA screening. Baseline PSA has ranged from 3.5-4, and DRE has been benign.  He had an isolated elevated value of 6.2 in January 2022, but this dropped back down to baseline of 3.82 weeks later on repeat testing.  PSA this year is stable at 4.5 from 4.0 last year.  We reviewed the risks and benefits of PSA screening, as well as the AUA guidelines.  This is excellent health, reasonable to continue screening yearly.  At our visit in January 2023 he had mentioned some concerns about erections and was interested in a trial of Cialis.  A prescription was sent but he actually never ended up using or needing this medication and denies any problems with ED today.  RTC 1 year PSA reflex to free prior   Sondra Come, MD   [facility] [address] [place], [place] [address] [phone]

## 2023-11-29 NOTE — Patient Instructions (Signed)
 Prostate Cancer Screening  Prostate cancer screening is testing that is done to check for the presence of prostate cancer in men. The prostate gland is a walnut-sized gland that is located below the bladder and in front of the rectum in males. The function of the prostate is to add fluid to semen during ejaculation. Prostate cancer is one of the most common types of cancer in men. Who should have prostate cancer screening? Screening recommendations vary based on age and other risk factors, as well as between the professional organizations who make the recommendations. In general, screening is recommended if: You are age 59 to 46 and have an average risk for prostate cancer. You should talk with your health care provider about your need for screening and how often screening should be done. Because most prostate cancers are slow growing and will not cause death, screening in this age group is generally reserved for men who have a 10- to 15-year life expectancy. You are younger than age 64, and you have these risk factors: Having a father, brother, or uncle who has been diagnosed with prostate cancer. The risk is higher if your family member's cancer occurred at an early age or if you have multiple family members with prostate cancer at an early age. Being a male who is [place] or is of [place] or sub-Saharan African descent. In general, screening is not recommended if: You are younger than age 71. You are between the ages of 93 and 30 and you have no risk factors. You are 74 years of age or older. At this age, the risks that screening can cause are greater than the benefits that it may provide. If you are at high risk for prostate cancer, your health care provider may recommend that you have screenings more often or that you start screening at a younger age. How is screening for prostate cancer done? The recommended prostate cancer screening test is a blood test called the prostate-specific antigen  (PSA) test. PSA is a protein that is made in the prostate. As you age, your prostate naturally produces more PSA. Abnormally high PSA levels may be caused by: Prostate cancer. An enlarged prostate that is not caused by cancer (benign prostatic hyperplasia, or BPH). This condition is very common in older men. A prostate gland infection (prostatitis) or urinary tract infection. Certain medicines such as male hormones (like testosterone) or other medicines that raise testosterone levels. A rectal exam may be done as part of prostate cancer screening to help provide information about the size of your prostate gland. When a rectal exam is performed, it should be done after the PSA level is drawn to avoid any effect on the results. Depending on the PSA results, you may need more tests, such as: A physical exam to check the size of your prostate gland, if not done as part of screening. Blood and imaging tests. A procedure to remove tissue samples from your prostate gland for testing (biopsy). This is the only way to know for certain if you have prostate cancer. What are the benefits of prostate cancer screening? Screening can help to identify cancer at an early stage, before symptoms start and when the cancer can be treated more easily. There is a small chance that screening may lower your risk of dying from prostate cancer. The chance is small because prostate cancer is a slow-growing cancer, and most men with prostate cancer die from a different cause. What are the risks of prostate cancer screening? The  main risk of prostate cancer screening is diagnosing and treating prostate cancer that would never have caused any symptoms or problems. This is called overdiagnosisand overtreatment. PSA screening cannot tell you if your PSA is high due to cancer or a different cause. A prostate biopsy is the only procedure to diagnose prostate cancer. Even the results of a biopsy may not tell you if your cancer needs to  be treated. Slow-growing prostate cancer may not need any treatment other than monitoring, so diagnosing and treating it may cause unnecessary stress or other side effects. Questions to ask your health care provider When should I start prostate cancer screening? What is my risk for prostate cancer? How often do I need screening? What type of screening tests do I need? How do I get my test results? What do my results mean? Do I need treatment? Where to find more information The American Cancer Society: www.cancer.org American Urological Association: www.auanet.org Contact a health care provider if: You have difficulty urinating. You have pain when you urinate or ejaculate. You have blood in your urine or semen. You have pain in your back or in the area of your prostate. Summary Prostate cancer is a common type of cancer in men. The prostate gland is located below the bladder and in front of the rectum. This gland adds fluid to semen during ejaculation. Prostate cancer screening may identify cancer at an early stage, when the cancer can be treated more easily and is less likely to have spread to other areas of the body. The prostate-specific antigen (PSA) test is the recommended screening test for prostate cancer, but it has associated risks. Discuss the risks and benefits of prostate cancer screening with your health care provider. If you are age 73 or older, the risks that screening can cause are greater than the benefits that it may provide. This information is not intended to replace advice given to you by your health care provider. Make sure you discuss any questions you have with your health care provider. Document Revised: 03/02/2021 Document Reviewed: 03/02/2021 Elsevier Patient Education  2024 [employer].

## 2024-05-17 ENCOUNTER — Encounter: Payer: Self-pay | Admitting: Urology

## 2024-09-06 ENCOUNTER — Encounter: Payer: Self-pay | Admitting: Urology

## 2024-11-27 ENCOUNTER — Ambulatory Visit: Admitting: Urology

## 2024-11-28 ENCOUNTER — Ambulatory Visit: Admitting: Urology
